# Patient Record
Sex: Female | Born: 1990 | Race: Black or African American | Hispanic: No | Marital: Single | Smoking: Never smoker
Health system: Southern US, Community
[De-identification: ages and names within clinical notes are randomized; demographics above are authoritative.]

## PROBLEM LIST (undated history)

## (undated) ENCOUNTER — Inpatient Hospital Stay (HOSPITAL_COMMUNITY): Payer: Self-pay

## (undated) DIAGNOSIS — R519 Headache, unspecified: Secondary | ICD-10-CM

## (undated) DIAGNOSIS — R51 Headache: Secondary | ICD-10-CM

## (undated) DIAGNOSIS — E669 Obesity, unspecified: Secondary | ICD-10-CM

## (undated) HISTORY — DX: Headache: R51

## (undated) HISTORY — DX: Headache, unspecified: R51.9

## (undated) HISTORY — PX: ANKLE SURGERY: SHX546

---

## 2011-09-27 ENCOUNTER — Ambulatory Visit: Payer: Self-pay | Admitting: Physician Assistant

## 2011-10-13 ENCOUNTER — Ambulatory Visit (INDEPENDENT_AMBULATORY_CARE_PROVIDER_SITE_OTHER): Payer: BC Managed Care – PPO | Admitting: Internal Medicine

## 2011-10-13 ENCOUNTER — Encounter: Payer: Self-pay | Admitting: Internal Medicine

## 2011-10-13 VITALS — BP 114/79 | HR 86 | Temp 97.5°F | Ht 61.75 in | Wt 206.0 lb

## 2011-10-13 DIAGNOSIS — S93401A Sprain of unspecified ligament of right ankle, initial encounter: Secondary | ICD-10-CM | POA: Insufficient documentation

## 2011-10-13 DIAGNOSIS — N926 Irregular menstruation, unspecified: Secondary | ICD-10-CM

## 2011-10-13 DIAGNOSIS — Z0000 Encounter for general adult medical examination without abnormal findings: Secondary | ICD-10-CM

## 2011-10-13 DIAGNOSIS — S93409A Sprain of unspecified ligament of unspecified ankle, initial encounter: Secondary | ICD-10-CM

## 2011-10-13 DIAGNOSIS — Z23 Encounter for immunization: Secondary | ICD-10-CM

## 2011-10-13 NOTE — Assessment & Plan Note (Signed)
Will get records on evaluation by podiatry.

## 2011-10-13 NOTE — Patient Instructions (Signed)
[email or web address]

## 2011-10-13 NOTE — Progress Notes (Signed)
  Subjective:    Patient ID: Debra Levy, female    DOB: 1990-10-07, 21 y.o.   MRN: [id number]  HPI 21 year old female presents to establish care. She reports that she has generally been healthy. She does note some irregular periods. However, these have not been bothersome enough to cause her to seek use of medications such as birth control to regulate her periods. She denies any heavy bleeding or cramping.  She notes that she recently hurt her right ankle playing basketball. She has been followed by podiatry and has been using a brace. She reports some improvement in her pain. She is also taking an unknown anti-inflammatory. She notes she has followup with podiatry later this month.  Outpatient Encounter Prescriptions as of 10/13/2011  Medication Sig Dispense Refill  . UNKNOWN TO PATIENT Pain medication for sprained ankle.        Review of Systems  Constitutional: Negative for fever, chills, appetite change, fatigue and unexpected weight change.  HENT: Negative for ear pain, congestion, sore throat, trouble swallowing, neck pain, voice change and sinus pressure.   Eyes: Negative for visual disturbance.  Respiratory: Negative for cough, shortness of breath, wheezing and stridor.   Cardiovascular: Negative for chest pain, palpitations and leg swelling.  Gastrointestinal: Negative for nausea, vomiting, abdominal pain, diarrhea, constipation, blood in stool, abdominal distention and anal bleeding.  Genitourinary: Positive for menstrual problem. Negative for dysuria and flank pain.  Musculoskeletal: Positive for arthralgias. Negative for myalgias and gait problem.  Skin: Negative for color change and rash.  Neurological: Negative for dizziness and headaches.  Hematological: Negative for adenopathy. Does not bruise/bleed easily.  Psychiatric/Behavioral: Negative for suicidal ideas, sleep disturbance and dysphoric mood. The patient is not nervous/anxious.        Objective:   Physical Exam    Constitutional: She is oriented to person, place, and time. She appears well-developed and well-nourished. No distress.  HENT:  Head: Normocephalic and atraumatic.  Right Ear: External ear normal.  Left Ear: External ear normal.  Nose: Nose normal.  Mouth/Throat: Oropharynx is clear and moist. No oropharyngeal exudate.  Eyes: Conjunctivae are normal. Pupils are equal, round, and reactive to light. Right eye exhibits no discharge. Left eye exhibits no discharge. No scleral icterus.  Neck: Normal range of motion. Neck supple. No tracheal deviation present. No thyromegaly present.  Cardiovascular: Normal rate, regular rhythm, normal heart sounds and intact distal pulses.  Exam reveals no gallop and no friction rub.   No murmur heard. Pulmonary/Chest: Effort normal and breath sounds normal. No respiratory distress. She has no wheezes. She has no rales. She exhibits no tenderness.  Abdominal: Soft. Bowel sounds are normal. She exhibits no distension. There is no tenderness.  Musculoskeletal: She exhibits no edema and no tenderness.       Right ankle: She exhibits normal range of motion and no swelling.  Lymphadenopathy:    She has no cervical adenopathy.  Neurological: She is alert and oriented to person, place, and time. No cranial nerve deficit. She exhibits normal muscle tone. Coordination normal.  Skin: Skin is warm and dry. No rash noted. She is not diaphoretic. No erythema. No pallor.  Psychiatric: She has a normal mood and affect. Her behavior is normal. Judgment and thought content normal.          Assessment & Plan:

## 2011-10-13 NOTE — Assessment & Plan Note (Signed)
Likely anovulatory cycles. Will check TSH with labs.

## 2011-10-14 LAB — CBC WITH DIFFERENTIAL/PLATELET
Basophils Absolute: 0 10*3/uL (ref 0.0–0.1)
Basophils Relative: 0 % (ref 0–1)
MCHC: 33.5 g/dL (ref 30.0–36.0)
Monocytes Absolute: 0.4 10*3/uL (ref 0.1–1.0)
Neutro Abs: 3.8 10*3/uL (ref 1.7–7.7)
Neutrophils Relative %: 59 % (ref 43–77)
Platelets: 241 10*3/uL (ref 150–400)
RDW: 13.2 % (ref 11.5–15.5)
WBC: 6.4 10*3/uL (ref 4.0–10.5)

## 2011-10-14 LAB — COMPREHENSIVE METABOLIC PANEL
ALT: 27 U/L (ref 0–35)
AST: 20 U/L (ref 0–37)
Albumin: 4.7 g/dL (ref 3.5–5.2)
CO2: 24 mEq/L (ref 19–32)
Calcium: 9.2 mg/dL (ref 8.4–10.5)
Chloride: 106 mEq/L (ref 96–112)
Potassium: 4 mEq/L (ref 3.5–5.3)

## 2011-10-14 LAB — LIPID PANEL
Cholesterol: 234 mg/dL — ABNORMAL HIGH (ref 0–200)
LDL Cholesterol: 174 mg/dL — ABNORMAL HIGH (ref 0–99)
VLDL: 8 mg/dL (ref 0–40)

## 2011-10-16 ENCOUNTER — Telehealth: Payer: Self-pay | Admitting: *Deleted

## 2011-10-16 NOTE — Telephone Encounter (Signed)
LMOM for patient to call back for lab results/SLS

## 2011-10-16 NOTE — Telephone Encounter (Signed)
Message copied by Regis Bill on Mon Oct 16, 2011  8:22 AM ------      Message from: Ronna Polio A      Created: Sun Oct 15, 2011  8:40 PM       Labs show that cholesterol is high. Recommend diet low in saturated fat and high in fiber. Repeat lipids and LFTS in 6 months.

## 2011-10-24 NOTE — Telephone Encounter (Signed)
Patient informed 10/16/11 Debra Levy lab result note/SLS

## 2012-04-04 ENCOUNTER — Ambulatory Visit: Payer: BC Managed Care – PPO

## 2012-04-17 ENCOUNTER — Ambulatory Visit (INDEPENDENT_AMBULATORY_CARE_PROVIDER_SITE_OTHER): Payer: BC Managed Care – PPO | Admitting: Internal Medicine

## 2012-04-17 ENCOUNTER — Encounter: Payer: Self-pay | Admitting: Internal Medicine

## 2012-04-17 VITALS — BP 112/70 | HR 91 | Temp 98.4°F | Ht 61.75 in | Wt 210.8 lb

## 2012-04-17 DIAGNOSIS — Z0000 Encounter for general adult medical examination without abnormal findings: Secondary | ICD-10-CM

## 2012-04-17 DIAGNOSIS — Z23 Encounter for immunization: Secondary | ICD-10-CM

## 2012-04-17 DIAGNOSIS — M79671 Pain in right foot: Secondary | ICD-10-CM | POA: Insufficient documentation

## 2012-04-17 DIAGNOSIS — M79609 Pain in unspecified limb: Secondary | ICD-10-CM

## 2012-04-17 DIAGNOSIS — Z13220 Encounter for screening for lipoid disorders: Secondary | ICD-10-CM

## 2012-04-17 LAB — LIPID PANEL
Cholesterol: 209 mg/dL — ABNORMAL HIGH (ref 0–200)
Triglycerides: 35 mg/dL (ref 0.0–149.0)

## 2012-04-17 LAB — COMPREHENSIVE METABOLIC PANEL
Albumin: 3.9 g/dL (ref 3.5–5.2)
BUN: 11 mg/dL (ref 6–23)
Calcium: 9.1 mg/dL (ref 8.4–10.5)
Chloride: 107 mEq/L (ref 96–112)
Glucose, Bld: 95 mg/dL (ref 70–99)
Potassium: 3.6 mEq/L (ref 3.5–5.1)

## 2012-04-17 NOTE — Progress Notes (Signed)
Subjective:    Patient ID: Debra Levy, female    DOB: 1991/02/14, 21 y.o.   MRN: [id number]  HPI 21 year old female presents for general exam. She reports that she is doing well. 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 She reports that she eats a relatively healthy diet. She reports that weight loss has been limited by decreased physical activity. She reports physical activity has been limited by right ankle and foot pain. She has been seen by a podiatrist and had cortisone injection into her foot with minimal improvement. She reports that she has tried participating in sports such as basketball but has been unable to because of pain. She takes neurontin with minimal improvement.  In regards to general health, she reports she is a nonsmoker, nondrinker. She follows basic safety measures such as wearing seatbelts in her car. She denies any issues with hearing or vision. She feels safe where she lives. She is not sexually active. Menstrual cycles have been regular.  Outpatient Encounter Prescriptions as of 04/17/2012  Medication Sig Dispense Refill  . gabapentin (NEURONTIN) 300 MG capsule Take 300 mg by mouth 2 (two) times daily.      Marland Kitchen DISCONTD: UNKNOWN TO PATIENT Pain medication for sprained ankle.       BP 112/70  Pulse 91  Temp 98.4 F (36.9 C) (Oral)  Ht 5' 1.75" (1.568 m)  Wt 210 lb 12 oz (95.596 kg)  BMI 38.86 kg/m2  SpO2 98%  LMP 04/10/2012  Review of Systems  Constitutional: Negative for fever, chills, appetite change, fatigue and unexpected weight change.  HENT: Negative for ear pain, congestion, sore throat, trouble swallowing, neck pain, voice change and sinus pressure.   Eyes: Negative for visual disturbance.  Respiratory: Negative for cough, shortness of breath, wheezing and stridor.   Cardiovascular: Negative for chest pain, palpitations and leg swelling.  Gastrointestinal: Negative for nausea, vomiting, abdominal pain, diarrhea, constipation,  blood in stool, abdominal distention and anal bleeding.  Genitourinary: Negative for dysuria and flank pain.  Musculoskeletal: Positive for myalgias and arthralgias. Negative for gait problem.  Skin: Negative for color change and rash.  Neurological: Negative for dizziness and headaches.  Hematological: Negative for adenopathy. Does not bruise/bleed easily.  Psychiatric/Behavioral: Negative for suicidal ideas, disturbed wake/sleep cycle and dysphoric mood. The patient is not nervous/anxious.        Objective:   Physical Exam  Constitutional: She is oriented to person, place, and time. She appears well-developed and well-nourished. No distress.  HENT:  Head: Normocephalic and atraumatic.  Right Ear: External ear normal.  Left Ear: External ear normal.  Nose: Nose normal.  Mouth/Throat: Oropharynx is clear and moist. No oropharyngeal exudate.  Eyes: Conjunctivae normal are normal. Pupils are equal, round, and reactive to light. Right eye exhibits no discharge. Left eye exhibits no discharge. No scleral icterus.  Neck: Normal range of motion. Neck supple. No tracheal deviation present. No thyromegaly present.  Cardiovascular: Normal rate, regular rhythm, normal heart sounds and intact distal pulses.  Exam reveals no gallop and no friction rub.   No murmur heard. Pulmonary/Chest: Effort normal and breath sounds normal. No respiratory distress. She has no wheezes. She has no rales. She exhibits no tenderness.  Abdominal: Soft. Bowel sounds are normal. She exhibits no distension. There is no tenderness.  Musculoskeletal: Normal range of motion. She exhibits no edema and no tenderness.       Right foot: She exhibits tenderness and deformity (pes planus). She exhibits normal  range of motion.  Lymphadenopathy:    She has no cervical adenopathy.  Neurological: She is alert and oriented to person, place, and time. No cranial nerve deficit. She exhibits normal muscle tone. Coordination normal.    Skin: Skin is warm and dry. No rash noted. She is not diaphoretic. No erythema. No pallor.  Psychiatric: She has a normal mood and affect. Her behavior is normal. Judgment and thought content normal.          Assessment & Plan:

## 2012-04-17 NOTE — Assessment & Plan Note (Signed)
General medical exam is normal today except as noted. We discussed making improvements in diet and increasing physical activity with a goal of weight loss. Patient reports no concerns in regards to hearing or vision. Reviewed avoidance of tobacco and alcohol use. We discussed elevated lipids on previous labs and plan to repeat labs today. Flu vaccine declined.

## 2012-04-17 NOTE — Assessment & Plan Note (Signed)
Patient with persistent right foot and ankle pain. Question if she might benefit from orthotics. Will set up podiatry evaluation.

## 2012-04-18 ENCOUNTER — Other Ambulatory Visit: Payer: BC Managed Care – PPO

## 2012-10-16 ENCOUNTER — Encounter: Payer: Self-pay | Admitting: Internal Medicine

## 2012-10-16 ENCOUNTER — Ambulatory Visit (INDEPENDENT_AMBULATORY_CARE_PROVIDER_SITE_OTHER): Payer: BC Managed Care – PPO | Admitting: Internal Medicine

## 2012-10-16 VITALS — BP 130/88 | HR 85 | Temp 98.5°F | Wt 219.0 lb

## 2012-10-16 DIAGNOSIS — R030 Elevated blood-pressure reading, without diagnosis of hypertension: Secondary | ICD-10-CM

## 2012-10-16 DIAGNOSIS — E785 Hyperlipidemia, unspecified: Secondary | ICD-10-CM | POA: Insufficient documentation

## 2012-10-16 DIAGNOSIS — E669 Obesity, unspecified: Secondary | ICD-10-CM | POA: Insufficient documentation

## 2012-10-16 DIAGNOSIS — G43909 Migraine, unspecified, not intractable, without status migrainosus: Secondary | ICD-10-CM | POA: Insufficient documentation

## 2012-10-16 DIAGNOSIS — IMO0001 Reserved for inherently not codable concepts without codable children: Secondary | ICD-10-CM

## 2012-10-16 MED ORDER — PROPRANOLOL HCL ER 60 MG PO CP24: 60.0000 mg | ORAL_CAPSULE | Freq: Every day | ORAL | Status: DC

## 2012-10-16 NOTE — Assessment & Plan Note (Signed)
Symptoms consistent with migraine headaches. Will try adding propranolol to help control headache symptoms. Continue tylenol or ibuprofen for severe pain. Pt will email or call with update. She will keep a headache diary to better identify triggers. Follow up 4 weeks and prn.

## 2012-10-16 NOTE — Assessment & Plan Note (Signed)
Will recheck LFTs and lipids with labs today.

## 2012-10-16 NOTE — Progress Notes (Signed)
Subjective:    Patient ID: Debra Levy, female    DOB: Aug 08, 1990, 21 y.o.   MRN: [id number]  HPI 22 year old female with history of obesity presents for followup. She reports she is generally feeling well. Over the last several months she has had frequent headaches. She describes these as pain across the top of her head with associated photophobia and phonophobia. She denies nausea or visual changes. Her mother suffers from similar migraine headaches. She is unsure what triggers these headaches questions whether certain foods such as spicy foods may contribute as she has noticed headaches after eating these foods. She denies any focal neurologic symptoms. She typically uses Tylenol or ibuprofen with resolution of her symptoms. Headaches occur several times per week.   Outpatient Encounter Prescriptions as of 10/16/2012  Medication Sig Dispense Refill  . [DISCONTINUED] gabapentin (NEURONTIN) 300 MG capsule Take 300 mg by mouth 2 (two) times daily.      . propranolol ER (INDERAL LA) 60 MG 24 hr capsule Take 1 capsule (60 mg total) by mouth daily.  30 capsule  3   No facility-administered encounter medications on file as of 10/16/2012.   BP 130/88  Pulse 85  Temp(Src) 98.5 F (36.9 C) (Oral)  Wt 219 lb (99.338 kg)  BMI 40.4 kg/m2  SpO2 97%  LMP 09/15/2012  Review of Systems  Constitutional: Negative for fever, chills, appetite change, fatigue and unexpected weight change.  HENT: Negative for ear pain, congestion, sore throat, trouble swallowing, neck pain, voice change and sinus pressure.   Eyes: Positive for photophobia. Negative for visual disturbance.  Respiratory: Negative for cough, shortness of breath, wheezing and stridor.   Cardiovascular: Negative for chest pain, palpitations and leg swelling.  Gastrointestinal: Negative for nausea, vomiting, abdominal pain, diarrhea, constipation, blood in stool, abdominal distention and anal bleeding.  Genitourinary: Negative for dysuria and  flank pain.  Musculoskeletal: Negative for myalgias, arthralgias and gait problem.  Skin: Negative for color change and rash.  Neurological: Positive for headaches. Negative for dizziness.  Hematological: Negative for adenopathy. Does not bruise/bleed easily.  Psychiatric/Behavioral: Negative for suicidal ideas, sleep disturbance and dysphoric mood. The patient is not nervous/anxious.        Objective:   Physical Exam  Constitutional: She is oriented to person, place, and time. She appears well-developed and well-nourished. No distress.  HENT:  Head: Normocephalic and atraumatic.  Right Ear: External ear normal.  Left Ear: External ear normal.  Nose: Nose normal.  Mouth/Throat: Oropharynx is clear and moist. No oropharyngeal exudate.  Eyes: Conjunctivae are normal. Pupils are equal, round, and reactive to light. Right eye exhibits no discharge. Left eye exhibits no discharge. No scleral icterus.  Neck: Normal range of motion. Neck supple. No tracheal deviation present. No thyromegaly present.  Cardiovascular: Normal rate, regular rhythm, normal heart sounds and intact distal pulses.  Exam reveals no gallop and no friction rub.   No murmur heard. Pulmonary/Chest: Effort normal and breath sounds normal. No accessory muscle usage. Not tachypneic. No respiratory distress. She has no decreased breath sounds. She has no wheezes. She has no rhonchi. She has no rales. She exhibits no tenderness.  Musculoskeletal: Normal range of motion. She exhibits no edema and no tenderness.  Lymphadenopathy:    She has no cervical adenopathy.  Neurological: She is alert and oriented to person, place, and time. No cranial nerve deficit. She exhibits normal muscle tone. Coordination normal.  Skin: Skin is warm and dry. No rash noted. She is not diaphoretic. No  erythema. No pallor.  Psychiatric: She has a normal mood and affect. Her behavior is normal. Judgment and thought content normal.           Assessment & Plan:

## 2012-10-16 NOTE — Assessment & Plan Note (Signed)
BP slightly elevated today on initial check. Will recheck at visit in 4 weeks.

## 2012-10-16 NOTE — Assessment & Plan Note (Signed)
Body mass index is 40.4 kg/(m^2).  Wt Readings from Last 3 Encounters:  10/16/12 219 lb (99.338 kg)  04/17/12 210 lb 12 oz (95.596 kg)  10/13/11 206 lb (93.441 kg) (98%*, Z = 2.03)   * Growth percentiles are based on CDC 2-20 Years data.   Encouraged effort at healthy diet and regular physical activity. Exercise has been limited by right ankle pain. Follow up 4 weeks.

## 2012-10-17 LAB — COMPREHENSIVE METABOLIC PANEL
BUN: 13 mg/dL (ref 6–23)
CO2: 24 mEq/L (ref 19–32)
Calcium: 9.2 mg/dL (ref 8.4–10.5)
Chloride: 106 mEq/L (ref 96–112)
Creatinine, Ser: 0.7 mg/dL (ref 0.4–1.2)
GFR: 135.99 mL/min (ref >60.00)
Glucose, Bld: 85 mg/dL (ref 70–99)

## 2012-10-17 LAB — CBC WITH DIFFERENTIAL/PLATELET
Basophils Relative: 0.2 % (ref 0.0–3.0)
Eosinophils Absolute: 0.1 10*3/uL (ref 0.0–0.7)
Eosinophils Relative: 2 % (ref 0.0–5.0)
Lymphocytes Relative: 40.8 % (ref 12.0–46.0)
MCHC: 34.5 g/dL (ref 30.0–36.0)
Neutrophils Relative %: 52.2 % (ref 43.0–77.0)
RBC: 4.49 Mil/uL (ref 3.87–5.11)
WBC: 6 10*3/uL (ref 4.5–10.5)

## 2012-10-17 LAB — LDL CHOLESTEROL, DIRECT: Direct LDL: 153.7 mg/dL

## 2012-10-17 LAB — LIPID PANEL
HDL: 49.9 mg/dL (ref >39.00)
Triglycerides: 33 mg/dL (ref 0.0–149.0)

## 2012-11-21 ENCOUNTER — Ambulatory Visit: Payer: BC Managed Care – PPO | Admitting: Internal Medicine

## 2012-12-05 ENCOUNTER — Ambulatory Visit: Payer: BC Managed Care – PPO | Admitting: Internal Medicine

## 2012-12-12 ENCOUNTER — Ambulatory Visit: Payer: BC Managed Care – PPO | Admitting: Adult Health

## 2013-02-25 ENCOUNTER — Ambulatory Visit (INDEPENDENT_AMBULATORY_CARE_PROVIDER_SITE_OTHER): Payer: BC Managed Care – PPO | Admitting: Adult Health

## 2013-02-25 ENCOUNTER — Encounter: Payer: Self-pay | Admitting: Adult Health

## 2013-02-25 VITALS — BP 110/68 | HR 86 | Temp 98.1°F | Resp 12 | Ht 61.75 in | Wt 224.0 lb

## 2013-02-25 DIAGNOSIS — H6692 Otitis media, unspecified, left ear: Secondary | ICD-10-CM | POA: Insufficient documentation

## 2013-02-25 DIAGNOSIS — H6690 Otitis media, unspecified, unspecified ear: Secondary | ICD-10-CM

## 2013-02-25 MED ORDER — ANTIPYRINE-BENZOCAINE 5.4-1.4 % OT SOLN: 3.0000 [drp] | OTIC | Status: DC | PRN

## 2013-02-25 MED ORDER — AMOXICILLIN-POT CLAVULANATE 875-125 MG PO TABS: 1.0000 | ORAL_TABLET | Freq: Two times a day (BID) | ORAL | Status: DC

## 2013-02-25 NOTE — Patient Instructions (Addendum)
Otalgia Otalgia is pain in or around the ear. When the pain is from the ear itself it is called primary otalgia. Pain may also be coming from somewhere else, like the head and neck. This is called secondary otalgia.  CAUSES  Causes of primary otalgia include:  Middle ear infection.  It can also be caused by injury to the ear or infection of the ear canal (swimmer's ear). Swimmer's ear causes pain, swelling and often drainage from the ear canal. Causes of secondary otalgia include:  Sinus infections.  Allergies and colds that cause stuffiness of the nose and tubes that drain the ears (eustachian tubes).  Dental problems like cavities, gum infections or teething.  Sore Throat (tonsillitis and pharyngitis).  Swollen glands in the neck.  Infection of the bone behind the ear (mastoiditis).  TMJ discomfort (problems with the joint between your jaw and your skull).  Other problems such as nerve disorders, circulation problems, heart disease and tumors of the head and neck can also cause symptoms of ear pain. This is rare. DIAGNOSIS  Evaluation, Diagnosis and Testing:  Examination by your medical caregiver is recommended to evaluate and diagnose the cause of otalgia.  Further testing or referral to a specialist may be indicated if the cause of the ear pain is not found and the symptom persists. TREATMENT   Your doctor may prescribe antibiotics if an ear infection is diagnosed.  Pain relievers and topical analgesics may be recommended.  It is important to take all medications as prescribed. HOME CARE INSTRUCTIONS   It may be helpful to sleep with the painful ear in the up position.  A warm compress over the painful ear may provide relief.  A soft diet and avoiding gum may help while ear pain is present. SEEK IMMEDIATE MEDICAL CARE IF:  You develop severe pain, a high fever, repeated vomiting or dehydration.  You develop extreme dizziness, headache, confusion, ringing in the  ears (tinnitus) or hearing loss. Document Released: 07/13/2004 Document Revised: 08/28/2011 Document Reviewed: 04/14/2009 [facility] Patient Information 2014 [place], [place].  Start Augmentin twice a day for 10 days. Also use the ear drops (Auralgan) 3 drops into the left ear every 2 hours as needed for pain. Please let us know if you are not any better within 3-4 days.

## 2013-02-25 NOTE — Progress Notes (Signed)
  Subjective:    Patient ID: Debra Levy, female    DOB: [birth year], 22 y.o.   MRN: [id number]  HPI  Patient is a pleasant 22 y/o female who presents with otalgia which started over the weekend. She denies fever or chills. She has not taken any over-the-counter medications. She reports that her mother and grandmother instilled some peroxide in her ear thinking that it would help. Patient reports it did not help.   Review of Systems  HENT: Positive for ear pain.        Objective:   Physical Exam  Constitutional: She is oriented to person, place, and time. No distress.  HENT:  Head: Normocephalic and atraumatic.  Right Ear: External ear normal.  Left tympanic membrane erythematous and inflamed..  Cardiovascular: Normal rate and regular rhythm.   Pulmonary/Chest: Effort normal. No respiratory distress.  Neurological: She is alert and oriented to person, place, and time.  Skin: Skin is warm and dry.  Psychiatric: She has a normal mood and affect. Her behavior is normal. Judgment and thought content normal.          Assessment & Plan:

## 2013-02-25 NOTE — Assessment & Plan Note (Signed)
Start Augmentin twice a day x10 days. Auralgan ear drops into left ear every 2 hours as needed for pain.

## 2013-02-28 ENCOUNTER — Telehealth: Payer: Self-pay | Admitting: Internal Medicine

## 2013-02-28 NOTE — Telephone Encounter (Signed)
Is she having fever? Antibiotic should cover ear infection. How often is she using the ear drops for pain? Dr. Dan Humphreys has a spot saved at 4:15pm that she can be seen at if she needs to. Needs to get here a little earlier, though.

## 2013-02-28 NOTE — Telephone Encounter (Signed)
Pt was seen on 9/9 by Raquel.  States pt had bad ear infection and was prescribed ear drops and antibiotic.  States they were told if it did not improve to call back.  Now calling stating it has not improved.  Asking what next step is.

## 2013-02-28 NOTE — Telephone Encounter (Signed)
Patient never returned call  

## 2013-02-28 NOTE — Telephone Encounter (Signed)
Spoke with patient she state she is not having any fever and has been using the ear drops every 2 hours. Offered patient an appointment to come in to see Dr. Dan Humphreys, she will call back to let me know if she will be able to make it.

## 2013-03-04 ENCOUNTER — Encounter: Payer: Self-pay | Admitting: Internal Medicine

## 2013-03-04 ENCOUNTER — Ambulatory Visit (INDEPENDENT_AMBULATORY_CARE_PROVIDER_SITE_OTHER): Payer: BC Managed Care – PPO | Admitting: Internal Medicine

## 2013-03-04 VITALS — BP 112/68 | HR 91 | Temp 98.6°F | Resp 14 | Wt 221.5 lb

## 2013-03-04 DIAGNOSIS — H6690 Otitis media, unspecified, unspecified ear: Secondary | ICD-10-CM

## 2013-03-04 DIAGNOSIS — H6692 Otitis media, unspecified, left ear: Secondary | ICD-10-CM

## 2013-03-04 MED ORDER — LEVOFLOXACIN 500 MG PO TABS: 500.0000 mg | ORAL_TABLET | Freq: Every day | ORAL | Status: DC

## 2013-03-04 NOTE — Assessment & Plan Note (Signed)
Symptoms of ear pain, drainage despite use of Augmentin. Exam remarkable for bulging TM with purulent effusion and diffuse inflammation. Will change antibiotics to Levaquin. Stop augmentin and stop auralgan. Will use Ibuprofen or Tylenol prn pain or fever. If symptoms of pain persistent, would favor referral to ENT for evaluation for tympanostomy tube.

## 2013-03-04 NOTE — Progress Notes (Signed)
Subjective:    Patient ID: Debra Levy, female    DOB: 05-16-1991, 22 y.o.   MRN: [id number]  HPI 22YO female presents for acute visit c/o recurrent left ear pain. She was initially seen on 02/25/2013 complaining of left ear pain. She was diagnosed with otitis media and treated with Augmentin. She reports that symptoms of pain initially improved however for the last couple of days symptoms have become much worse with increased pain and occasional purulent drainage from her ear. She denies fever or chills. She denies nasal congestion or cough. She has not recently been swimming. She denies trauma to her ear. She does not have a history of resistant infections.  Outpatient Prescriptions Prior to Visit  Medication Sig Dispense Refill  . amoxicillin-clavulanate (AUGMENTIN) 875-125 MG per tablet Take 1 tablet by mouth 2 (two) times daily.  20 tablet  0  . antipyrine-benzocaine (AURALGAN) otic solution Place 3 drops into the left ear every 2 (two) hours as needed for pain.  10 mL  0   No facility-administered medications prior to visit.   BP 112/68  Pulse 91  Temp(Src) 98.6 F (37 C) (Oral)  Resp 14  Wt 221 lb 8 oz (100.472 kg)  BMI 40.87 kg/m2  SpO2 99%  LMP 02/11/2013  Review of Systems  Constitutional: Negative for fever, chills and unexpected weight change.  HENT: Positive for ear pain and ear discharge. Negative for hearing loss, nosebleeds, congestion, sore throat, facial swelling, rhinorrhea, sneezing, mouth sores, trouble swallowing, neck pain, neck stiffness, voice change, postnasal drip, sinus pressure and tinnitus.   Eyes: Negative for pain, discharge, redness and visual disturbance.  Respiratory: Negative for cough, chest tightness, shortness of breath, wheezing and stridor.   Cardiovascular: Negative for chest pain, palpitations and leg swelling.  Musculoskeletal: Negative for myalgias and arthralgias.  Skin: Negative for color change and rash.  Neurological: Negative for  dizziness, weakness, light-headedness and headaches.  Hematological: Negative for adenopathy.       Objective:   Physical Exam  Constitutional: She is oriented to person, place, and time. She appears well-developed and well-nourished. No distress.  HENT:  Head: Normocephalic and atraumatic.  Right Ear: Tympanic membrane, external ear and ear canal normal.  Left Ear: External ear normal. Tympanic membrane is injected, erythematous and bulging. A middle ear effusion is present.  Nose: Nose normal.  Mouth/Throat: Oropharynx is clear and moist. No oropharyngeal exudate.  Eyes: Conjunctivae are normal. Pupils are equal, round, and reactive to light. Right eye exhibits no discharge. Left eye exhibits no discharge. No scleral icterus.  Neck: Normal range of motion. Neck supple. No tracheal deviation present. No thyromegaly present.  Cardiovascular: Normal rate, regular rhythm, normal heart sounds and intact distal pulses.  Exam reveals no gallop and no friction rub.   No murmur heard. Pulmonary/Chest: Effort normal and breath sounds normal. No accessory muscle usage. Not tachypneic. No respiratory distress. She has no decreased breath sounds. She has no wheezes. She has no rhonchi. She has no rales. She exhibits no tenderness.  Musculoskeletal: Normal range of motion. She exhibits no edema and no tenderness.  Lymphadenopathy:    She has no cervical adenopathy.  Neurological: She is alert and oriented to person, place, and time. No cranial nerve deficit. She exhibits normal muscle tone. Coordination normal.  Skin: Skin is warm and dry. No rash noted. She is not diaphoretic. No erythema. No pallor.  Psychiatric: She has a normal mood and affect. Her behavior is normal. Judgment and thought  content normal.          Assessment & Plan:

## 2013-03-04 NOTE — Patient Instructions (Signed)
Email with update on Thursday.  Call immediately if any persistent fever, chills, ear pain.  Stop Auralgan.

## 2013-03-11 ENCOUNTER — Ambulatory Visit: Payer: BC Managed Care – PPO | Admitting: Internal Medicine

## 2013-03-12 ENCOUNTER — Ambulatory Visit (INDEPENDENT_AMBULATORY_CARE_PROVIDER_SITE_OTHER): Payer: BC Managed Care – PPO | Admitting: Adult Health

## 2013-03-12 ENCOUNTER — Encounter: Payer: Self-pay | Admitting: Adult Health

## 2013-03-12 VITALS — BP 108/76 | HR 74 | Temp 98.2°F | Resp 12 | Wt 223.5 lb

## 2013-03-12 DIAGNOSIS — H6692 Otitis media, unspecified, left ear: Secondary | ICD-10-CM

## 2013-03-12 DIAGNOSIS — H6690 Otitis media, unspecified, unspecified ear: Secondary | ICD-10-CM

## 2013-03-12 NOTE — Assessment & Plan Note (Signed)
Unresolved despite 2 courses of antibiotics. Now with some hearing loss. Refer to ENT.

## 2013-03-12 NOTE — Progress Notes (Signed)
  Subjective:    Patient ID: Debra Levy, female    DOB: [birth year], 22 y.o.   MRN: [id number]  HPI  Patient is a pleasant 22 y/o female who presents to clinic with ongoing problems with her left ear. She was seen earlier in the month for left ear pain, otitis and treated with Augmentin. Symptoms persisted so she was given levaquin. She completed course of Levaquin 2 days ago. Symptoms are ongoing with complaints of pain in the left ear. She is also having some hearing loss on that side as well. She denies fever or any other symptom.   Review of Systems  Constitutional: Negative for fever and chills.  HENT: Positive for hearing loss and ear pain. Negative for congestion, postnasal drip, sinus pressure and tinnitus.        Objective:   Physical Exam  Constitutional: She is oriented to person, place, and time.  22 year old female in no apparent distress  HENT:  Right Ear: External ear normal.  Left otitis media  Neurological: She is alert and oriented to person, place, and time.  Skin: Skin is warm and dry.  Psychiatric: She has a normal mood and affect. Her behavior is normal. Judgment and thought content normal.          Assessment & Plan:

## 2013-03-15 ENCOUNTER — Emergency Department (HOSPITAL_COMMUNITY)
Admission: EM | Admit: 2013-03-15 | Discharge: 2013-03-15 | Disposition: A | Discharge status: Home / Self Care | Payer: BC Managed Care – PPO | Attending: Emergency Medicine | Admitting: Emergency Medicine

## 2013-03-15 ENCOUNTER — Encounter (HOSPITAL_COMMUNITY): Payer: Self-pay | Admitting: *Deleted

## 2013-03-15 DIAGNOSIS — J45909 Unspecified asthma, uncomplicated: Secondary | ICD-10-CM | POA: Insufficient documentation

## 2013-03-15 DIAGNOSIS — H6092 Unspecified otitis externa, left ear: Secondary | ICD-10-CM

## 2013-03-15 DIAGNOSIS — H60399 Other infective otitis externa, unspecified ear: Secondary | ICD-10-CM | POA: Insufficient documentation

## 2013-03-15 MED ORDER — TRAMADOL HCL 50 MG PO TABS: 50.0000 mg | ORAL_TABLET | Freq: Four times a day (QID) | ORAL | Status: DC | PRN

## 2013-03-15 NOTE — ED Notes (Signed)
Pt c/o increase pain and swelling in left ear, around the outside of the ear, and down the left jaw line since yesterday after her ENT took a culture.  Left neck and cheek swelling visible and tender to touch.  Ear canal swelling also noted. Pt reports drainage.  Pt reports new ear drops since her visit yesterday as well.

## 2013-03-15 NOTE — ED Provider Notes (Signed)
Medical screening examination/treatment/procedure(s) were performed by non-physician practitioner and as supervising physician I was immediately available for consultation/collaboration.   Gwyneth Sprout, MD 03/15/13 [phone]

## 2013-03-15 NOTE — ED Notes (Signed)
Pt and family member reports having left ear infection x 2 weeks, has taken amoxicillin and levaquin with no relief. Pain is increasing and now pain when talking. Airway intact.

## 2013-03-15 NOTE — ED Provider Notes (Signed)
CSN: [id number]     Arrival date & time 03/15/13  1603 History  This chart was scribed for non-physician practitioner Wynetta Emery, PA-C working with Gwyneth Sprout, MD by Danella Maiers, ED Scribe. This patient was seen in room TR06C/TR06C and the patient's care was started at 4:29 PM.   Chief Complaint  Patient presents with  . Otalgia   Patient is a 22 y.o. female presenting with ear pain. The history is provided by the patient. No language interpreter was used.  Otalgia  HPI Comments: Debra Levy is a 22 y.o. female who presents to the Emergency Department complaining of worsening left ear pain with associated tinnitus and facial swelling onset two weeks ago, when Pt had dental procedure that resulted in an  abcess. Pt reports taking amoxicillin x2 weeks ago and was switched to levaquin. She reports no relief with levaquin. She received ciprodex antibiotic ear drops from [facility] yesterday. She reports pain is worse when eating. She has been taking ibuprofen and tylenol. She denies fever, cough, nausea, emesis, sinus pressure, difficulty swallowing. She denies h/o diabetes.  Past Medical History  Diagnosis Date  . Asthma    History reviewed. No pertinent past surgical history. Family History  Problem Relation Age of Onset  . Diabetes Mother   . Diabetes Father    History  Substance Use Topics  . Smoking status: Never Smoker   . Smokeless tobacco: Never Used  . Alcohol Use: No   OB History   Grav Para Term Preterm Abortions TAB SAB Ect Mult Living                 Review of Systems  HENT: Positive for ear pain.   A complete 10 system review of systems was obtained and all systems are negative except as noted in the HPI and PMH.   Allergies  Review of patient's allergies indicates no known allergies.  Home Medications  No current outpatient prescriptions on file. BP 120/66  Pulse 100  Temp(Src) 98.5 F (36.9 C) (Oral)  Resp 18  SpO2 97%  LMP  02/12/2013 Physical Exam  Nursing note and vitals reviewed. Constitutional: She is oriented to person, place, and time. She appears well-developed and well-nourished. No distress.  HENT:  Head: Normocephalic.  Mouth/Throat: Oropharynx is clear and moist.  Left outer ear canal erythematous and edematous, TM is not bulging, mastoid is nontender to palpation.  No facial swelling or induration. Generally good dentition, no gingival swelling, erythema or tenderness to palpation. Patient is handling their secretions. There is no tenderness to palpation or firmness underneath tongue bilaterally. No trismus.    Eyes: Conjunctivae and EOM are normal. Pupils are equal, round, and reactive to light.  Neck: Normal range of motion. Neck supple.  Cardiovascular: Normal rate.   Pulmonary/Chest: Effort normal and breath sounds normal. No stridor. No respiratory distress. She has no wheezes. She has no rales. She exhibits no tenderness.  Abdominal: Soft. Bowel sounds are normal. She exhibits no distension and no mass. There is no tenderness. There is no rebound and no guarding.  Musculoskeletal: Normal range of motion.  Lymphadenopathy:    She has cervical adenopathy.  Neurological: She is alert and oriented to person, place, and time.  Psychiatric: She has a normal mood and affect.    ED Course  Procedures (including critical care time) Medications - No data to display  DIAGNOSTIC STUDIES: Oxygen Saturation is 97% on RA, normal by my interpretation.    COORDINATION OF CARE:  4:54 PM- Discussed treatment plan with pt which includes continuing ear drops and treating with pain meds. Pt agrees to plan.    Labs Review Labs Reviewed - No data to display Imaging Review No results found.  MDM   1. Otitis externa, left     Filed Vitals:   03/15/13 1613  BP: 120/66  Pulse: 100  Temp: 98.5 F (36.9 C)  TempSrc: Oral  Resp: 18  SpO2: 97%     Debra Levy is a 22 y.o. female with otitis  externa. She started Ciprodex yesterday. I have explained to patient that she will need to give the medication time to work. In addition I will write her for additional pain medication. I've discussed return precautions and asked her to follow with her ear nose and throat physician.  Pt is hemodynamically stable, appropriate for, and amenable to discharge at this time. Pt verbalized understanding and agrees with care plan. All questions answered. Outpatient follow-up and specific return precautions discussed.    New Prescriptions   TRAMADOL (ULTRAM) 50 MG TABLET    Take 1 tablet (50 mg total) by mouth every 6 (six) hours as needed for pain.    I personally performed the services described in this documentation, which was scribed in my presence. The recorded information has been reviewed and is accurate.  Note: Portions of this report may have been transcribed using voice recognition software. Every effort was made to ensure accuracy; however, inadvertent computerized transcription errors may be present    Wynetta Emery, PA-C 03/15/13 2058

## 2013-04-17 ENCOUNTER — Ambulatory Visit (INDEPENDENT_AMBULATORY_CARE_PROVIDER_SITE_OTHER): Payer: BC Managed Care – PPO

## 2013-04-17 DIAGNOSIS — Z23 Encounter for immunization: Secondary | ICD-10-CM

## 2013-08-01 ENCOUNTER — Ambulatory Visit: Payer: BC Managed Care – PPO | Admitting: Internal Medicine

## 2013-11-05 ENCOUNTER — Ambulatory Visit: Payer: BC Managed Care – PPO | Admitting: Internal Medicine

## 2013-11-17 ENCOUNTER — Ambulatory Visit: Payer: BC Managed Care – PPO | Admitting: Internal Medicine

## 2014-02-10 ENCOUNTER — Ambulatory Visit (INDEPENDENT_AMBULATORY_CARE_PROVIDER_SITE_OTHER): Payer: BC Managed Care – PPO | Admitting: Adult Health

## 2014-02-10 ENCOUNTER — Encounter: Payer: Self-pay | Admitting: Adult Health

## 2014-02-10 VITALS — BP 116/80 | HR 83 | Temp 98.2°F | Resp 16 | Ht 63.0 in | Wt 220.5 lb

## 2014-02-10 DIAGNOSIS — H9202 Otalgia, left ear: Secondary | ICD-10-CM

## 2014-02-10 DIAGNOSIS — H9209 Otalgia, unspecified ear: Secondary | ICD-10-CM

## 2014-02-10 NOTE — Progress Notes (Signed)
Pre visit review using our clinic review tool, if applicable. No additional management support is needed unless otherwise documented below in the visit note. 

## 2014-02-10 NOTE — Progress Notes (Signed)
Patient ID: Debra Levy, female   DOB: Dec 15, 1990, 23 y.o.   MRN: [id number]   Subjective:    Patient ID: Debra Levy, female    DOB: 1991/01/10, 23 y.o.   MRN: [id number]  HPI Pt is a pleasant 23 y/o female who presents to clinic for c/o left outer ear pain when she wears her headsets. Hx of otitis media and externa. Denies any internal pain, hearing difficulties or other associated symptoms.   Past Medical History  Diagnosis Date  . Asthma      Review of Systems  Constitutional: Negative for fever and chills.  HENT: Positive for ear pain (left external ear pain).   All other systems reviewed and are negative.      Objective:  BP 116/80  Pulse 83  Temp(Src) 98.2 F (36.8 C) (Oral)  Resp 16  Ht  (1.6 m)  Wt 220 lb 8 oz (100.018 kg)  BMI 39.07 kg/m2  SpO2 98%   Physical Exam  Constitutional: She is oriented to person, place, and time. No distress.  HENT:  Head: Normocephalic and atraumatic.  Right Ear: External ear normal.  Left external ear normal in appearance. No erythema or inflammation noted. Internally, TM is intact. The canal has a small amount of cerumen without obstructing the view of TM.   Cardiovascular: Normal rate and regular rhythm.   Pulmonary/Chest: Effort normal. No respiratory distress.  Lymphadenopathy:    She has no cervical adenopathy.  Neurological: She is alert and oriented to person, place, and time.  Skin: Skin is warm and dry.  Psychiatric: She has a normal mood and affect. Her behavior is normal. Judgment and thought content normal.      Assessment & Plan:   1. Ear pain, left No s/s infection. Pain is only with use of headset. Suspect the pain/discomfort is coming from headsets that may be too tight. Instructed pt to make sure this is not occuring. No tenderness with palpation of external ear or surrounding areas.

## 2014-02-19 ENCOUNTER — Ambulatory Visit: Payer: BC Managed Care – PPO | Admitting: Internal Medicine

## 2014-03-02 ENCOUNTER — Ambulatory Visit: Payer: BC Managed Care – PPO | Admitting: Internal Medicine

## 2014-03-02 DIAGNOSIS — Z0289 Encounter for other administrative examinations: Secondary | ICD-10-CM

## 2014-03-26 ENCOUNTER — Ambulatory Visit: Payer: BC Managed Care – PPO | Admitting: Internal Medicine

## 2014-05-12 ENCOUNTER — Ambulatory Visit: Payer: BC Managed Care – PPO | Admitting: Nurse Practitioner

## 2014-05-13 ENCOUNTER — Ambulatory Visit: Payer: BC Managed Care – PPO | Admitting: Nurse Practitioner

## 2014-05-13 ENCOUNTER — Telehealth: Payer: Self-pay | Admitting: Internal Medicine

## 2014-05-13 NOTE — Telephone Encounter (Signed)
Do you want her no-showed and left on schedule, or removed so is not a no show.

## 2014-05-13 NOTE — Telephone Encounter (Signed)
Pt arrived late for appt. The appt has rescheduled. Please advise to off schedule for today/msn

## 2014-05-20 ENCOUNTER — Ambulatory Visit: Payer: BC Managed Care – PPO | Admitting: Nurse Practitioner

## 2014-07-27 ENCOUNTER — Ambulatory Visit: Payer: Self-pay | Admitting: Internal Medicine

## 2014-08-05 ENCOUNTER — Emergency Department (HOSPITAL_COMMUNITY)
Admission: EM | Admit: 2014-08-05 | Discharge: 2014-08-05 | Disposition: A | Discharge status: Home / Self Care | Payer: BLUE CROSS/BLUE SHIELD | Attending: Emergency Medicine | Admitting: Emergency Medicine

## 2014-08-05 ENCOUNTER — Encounter (HOSPITAL_COMMUNITY): Payer: Self-pay | Admitting: *Deleted

## 2014-08-05 DIAGNOSIS — R519 Headache, unspecified: Secondary | ICD-10-CM

## 2014-08-05 DIAGNOSIS — E669 Obesity, unspecified: Secondary | ICD-10-CM | POA: Insufficient documentation

## 2014-08-05 DIAGNOSIS — R51 Headache: Secondary | ICD-10-CM | POA: Insufficient documentation

## 2014-08-05 DIAGNOSIS — J45909 Unspecified asthma, uncomplicated: Secondary | ICD-10-CM | POA: Insufficient documentation

## 2014-08-05 HISTORY — DX: Obesity, unspecified: E66.9

## 2014-08-05 MED ORDER — DIPHENHYDRAMINE HCL 50 MG/ML IJ SOLN
25.0000 mg | Freq: Once | INTRAMUSCULAR | Status: AC
  Administered 2014-08-05: 25 mg via INTRAVENOUS
  Filled 2014-08-05: qty 1

## 2014-08-05 MED ORDER — ASPIRIN-ACETAMINOPHEN-CAFFEINE 250-250-65 MG PO TABS: 1.0000 | ORAL_TABLET | Freq: Four times a day (QID) | ORAL | Status: DC | PRN

## 2014-08-05 MED ORDER — SODIUM CHLORIDE 0.9 % IV BOLUS (SEPSIS)
1000.0000 mL | Freq: Once | INTRAVENOUS | Status: AC
  Administered 2014-08-05: 1000 mL via INTRAVENOUS

## 2014-08-05 MED ORDER — DEXAMETHASONE SODIUM PHOSPHATE 10 MG/ML IJ SOLN
10.0000 mg | Freq: Once | INTRAMUSCULAR | Status: AC
  Administered 2014-08-05: 10 mg via INTRAVENOUS
  Filled 2014-08-05: qty 1

## 2014-08-05 MED ORDER — METOCLOPRAMIDE HCL 5 MG/ML IJ SOLN
10.0000 mg | Freq: Once | INTRAMUSCULAR | Status: AC
Start: 2014-08-05 — End: 2014-08-05
  Administered 2014-08-05: 10 mg via INTRAVENOUS
  Filled 2014-08-05: qty 2

## 2014-08-05 NOTE — ED Provider Notes (Signed)
CSN: [id number]     Arrival date & time 08/05/14  1635 History   First Levy Initiated Contact with Patient 08/05/14 2101     Chief Complaint  Patient presents with  . Headache     (Consider location/radiation/quality/duration/timing/severity/associated sxs/prior Treatment) HPI   PCP: Debra Levy Blood pressure 107/81, pulse 81, temperature 98.1 F (36.7 C), temperature source Oral, resp. rate 18, SpO2 100 %.  Debra Levy is a 24 y.o.female with a significant PMH of headaches, asthma, obesity presents to the ER with complaints of headache. She gets headaches on and off and this headache is of the same quality. She has been visiting her mom upstairs who is admitted and admits to decreased fluid and food intake. She has had some sensitivity to light, the pain is left parietal, and pulsating.  Negative Review of Symptoms: no weakness (generalized or focal), change of vision, neck pain, fever, chest pain, SOB, confusion, blurry vision, vomiting, diarrhea, nausea, confusion, dysuria, vaginal discharge, vaginal bleeding.  Past Medical History  Diagnosis Date  . Asthma   . Obesity    History reviewed. No pertinent past surgical history. Family History  Problem Relation Age of Onset  . Diabetes Mother   . Diabetes Father    History  Substance Use Topics  . Smoking status: Never Smoker   . Smokeless tobacco: Never Used  . Alcohol Use: No   OB History    No data available     Review of Systems  10 Systems reviewed and are negative for acute change except as noted in the HPI.   Allergies  Review of patient's allergies indicates no known allergies.  Home Medications   Prior to Admission medications   Medication Sig Start Date End Date Taking? Authorizing Provider  aspirin-acetaminophen-caffeine (EXCEDRIN MIGRAINE) [phone] MG per tablet Take 1 tablet by mouth every 6 (six) hours as needed for headache. 08/05/14   Dorthula Matasiffany G Ersie Savino, PA-C   BP 107/81 mmHg   Pulse 81  Temp(Src) 98.1 F (36.7 C) (Oral)  Resp 18  SpO2 100%  LMP  (LMP Unknown) Physical Exam  Constitutional: She is oriented to person, place, and time. She appears well-developed and well-nourished. No distress.  HENT:  Head: Normocephalic and atraumatic.  Eyes: Pupils are equal, round, and reactive to light.  Neck: Normal range of motion. Neck supple.  Cardiovascular: Normal rate and regular rhythm.   Pulmonary/Chest: Effort normal.  Abdominal: Soft.  Neurological: She is alert and oriented to person, place, and time. She has normal strength. No cranial nerve deficit or sensory deficit. She displays a negative Romberg sign.  Cranial nerves II-VIII and X-XII evaluated and show no deficits. Pt alert and oriented x 3 Upper and lower extremity strength is symmetrical and physiologic Normal muscular tone No facial droop Coordination intact, no limb ataxia, finger-nose-finger normal Rapid alternating movements normal No pronator drift  Skin: Skin is warm and dry.  Nursing note and vitals reviewed.   ED Course  Procedures (including critical care time) Labs Review Labs Reviewed - No data to display  Imaging Review No results found.   EKG Interpretation None      MDM   Final diagnoses:  Nonintractable headache, unspecified chronicity pattern, unspecified headache type    Medications  sodium chloride 0.9 % bolus 1,000 mL (1,000 mLs Intravenous New Bag/Given 08/05/14 2032)  diphenhydrAMINE (BENADRYL) injection 25 mg (25 mg Intravenous Given 08/05/14 2135)  dexamethasone (DECADRON) injection 10 mg (10 mg Intravenous Given 08/05/14 2135)  metoCLOPramide (  REGLAN) injection 10 mg (10 mg Intravenous Given 08/05/14 2135)    Patients headache relieved with the migraine cocktail.  Presentation is non concerning for [facility], [facility], Meningitis, or temporal arteritis. Pt is afebrile with no focal neuro deficits, nuchal rigidity, or change in vision. The patient denies any symptoms  of neurological impairment or TIA's; no amaurosis, diplopia, dysphasia, or unilateral disturbance of motor or sensory function. No loss of balance or vertigo.  Will give RX for Excedrin Migraine and she can follow-up with PCP.  23 y.o.Debra Levy's evaluation in the Emergency Department is complete. It has been determined that no acute conditions requiring further emergency intervention are present at this time. The patient/guardian have been advised of the diagnosis and plan. We have discussed signs and symptoms that warrant return to the ED, such as changes or worsening in symptoms.  Vital signs are stable at discharge. Filed Vitals:   08/05/14 2130  BP: 107/81  Pulse: 81  Temp:   Resp:    Patient/guardian has voiced understanding and agreed to follow-up with the PCP or specialist.      Dorthula Matas, PA-C 08/05/14 2231  Donnetta Hutching, Levy 08/05/14 2348

## 2014-08-05 NOTE — ED Notes (Signed)
Pt reports having severe headache for several days and no relief with tylenol. Pt having sensitivity to light but denies n/v.

## 2014-08-05 NOTE — Discharge Instructions (Signed)

## 2014-08-08 ENCOUNTER — Encounter (HOSPITAL_COMMUNITY): Payer: Self-pay | Admitting: *Deleted

## 2014-08-08 ENCOUNTER — Emergency Department (HOSPITAL_COMMUNITY): Payer: BLUE CROSS/BLUE SHIELD

## 2014-08-08 ENCOUNTER — Emergency Department (HOSPITAL_COMMUNITY)
Admission: EM | Admit: 2014-08-08 | Discharge: 2014-08-09 | Disposition: A | Discharge status: Home / Self Care | Payer: BLUE CROSS/BLUE SHIELD | Attending: Emergency Medicine | Admitting: Emergency Medicine

## 2014-08-08 DIAGNOSIS — J45909 Unspecified asthma, uncomplicated: Secondary | ICD-10-CM | POA: Diagnosis not present

## 2014-08-08 DIAGNOSIS — R51 Headache: Secondary | ICD-10-CM | POA: Insufficient documentation

## 2014-08-08 DIAGNOSIS — R519 Headache, unspecified: Secondary | ICD-10-CM

## 2014-08-08 DIAGNOSIS — E669 Obesity, unspecified: Secondary | ICD-10-CM | POA: Insufficient documentation

## 2014-08-08 DIAGNOSIS — R42 Dizziness and giddiness: Secondary | ICD-10-CM | POA: Diagnosis not present

## 2014-08-08 DIAGNOSIS — H53149 Visual discomfort, unspecified: Secondary | ICD-10-CM | POA: Insufficient documentation

## 2014-08-08 MED ORDER — DEXAMETHASONE SODIUM PHOSPHATE 10 MG/ML IJ SOLN
10.0000 mg | Freq: Once | INTRAMUSCULAR | Status: DC
Start: 2014-08-08 — End: 2014-08-08
  Filled 2014-08-08: qty 1

## 2014-08-08 MED ORDER — METOCLOPRAMIDE HCL 5 MG/ML IJ SOLN
10.0000 mg | Freq: Once | INTRAMUSCULAR | Status: DC
  Filled 2014-08-08: qty 2

## 2014-08-08 MED ORDER — DIPHENHYDRAMINE HCL 50 MG/ML IJ SOLN
25.0000 mg | Freq: Once | INTRAMUSCULAR | Status: DC
  Filled 2014-08-08: qty 1

## 2014-08-08 MED ORDER — SODIUM CHLORIDE 0.9 % IV BOLUS (SEPSIS)
1000.0000 mL | Freq: Once | INTRAVENOUS | Status: AC
  Administered 2014-08-08: 1000 mL via INTRAVENOUS

## 2014-08-08 NOTE — ED Provider Notes (Signed)
CSN: [id number]     Arrival date & time 08/08/14  2231 History   First MD Initiated Contact with Patient 08/08/14 2248     Chief Complaint  Patient presents with  . Headache     (Consider location/radiation/quality/duration/timing/severity/associated sxs/prior Treatment) Patient is a 24 y.o. female presenting with headaches. The history is provided by the patient and medical records.  Headache Associated symptoms: dizziness and photophobia     This is a 24 year old female with past medical history significant for asthma, presenting to the ED for headache. She patient was seen in the ED 3 days ago for the same, states headache improved with medication in the ED but never completely resolved. She states since returning home headache has gotten progressively worse. Headache localized to the top of her head and back of her scalp. She denies any neck pain. She notes intermittent dizziness, lightheadedness, photophobia, and phonophobia, nausea, or vomiting. She denies any tinnitus, changes in speech, confusion, or visual disturbance.  Patient has continued taking excedrin migraine without improvement.  Patient does not have a personal history of migraines, however mother developed migraines at a similar age.  No reported fever, chills.  VSS on arrival.  Past Medical History  Diagnosis Date  . Asthma   . Obesity    History reviewed. No pertinent past surgical history. Family History  Problem Relation Age of Onset  . Diabetes Mother   . Diabetes Father    History  Substance Use Topics  . Smoking status: Never Smoker   . Smokeless tobacco: Never Used  . Alcohol Use: No   OB History    No data available     Review of Systems  Eyes: Positive for photophobia.  Neurological: Positive for dizziness, light-headedness and headaches.  All other systems reviewed and are negative.     Allergies  Review of patient's allergies indicates no known allergies.  Home Medications   Prior to  Admission medications   Medication Sig Start Date End Date Taking? Authorizing Provider  aspirin-acetaminophen-caffeine (EXCEDRIN MIGRAINE) [phone] MG per tablet Take 1 tablet by mouth every 6 (six) hours as needed for headache. 08/05/14   Dorthula Matas, PA-C   BP 107/76 mmHg  Pulse 88  Temp(Src) 98.3 F (36.8 C) (Oral)  Resp 16  Ht  (1.626 m)  Wt 212 lb (96.163 kg)  BMI 36.37 kg/m2  SpO2 98%  LMP 06/07/2014   Physical Exam  Constitutional: She is oriented to person, place, and time. She appears well-developed and well-nourished. No distress.  HENT:  Head: Normocephalic and atraumatic.  Mouth/Throat: Oropharynx is clear and moist.  Eyes: Conjunctivae and EOM are normal. Pupils are equal, round, and reactive to light.  Neck: Normal range of motion. Neck supple.  No meningismus  Cardiovascular: Normal rate, regular rhythm and normal heart sounds.   Pulmonary/Chest: Effort normal and breath sounds normal. No respiratory distress. She has no wheezes.  Abdominal: Soft. Bowel sounds are normal. There is no tenderness. There is no guarding.  Musculoskeletal: Normal range of motion.  Neurological: She is alert and oriented to person, place, and time.  AAOx3, answering questions appropriately; equal strength UE and LE bilaterally; CN grossly intact; moves all extremities appropriately without ataxia; no focal neuro deficits or facial asymmetry appreciated  Skin: Skin is warm and dry. She is not diaphoretic.  Psychiatric: She has a normal mood and affect.  Nursing note and vitals reviewed.   ED Course  Procedures (including critical care time) Labs Review Labs Reviewed -  No data to display  Imaging Review Ct Head Wo Contrast  08/09/2014   CLINICAL DATA:  Left posterior headache intermittently for 1/2 weeks.  EXAM: CT HEAD WITHOUT CONTRAST  TECHNIQUE: Contiguous axial images were obtained from the base of the skull through the vertex without intravenous contrast.  COMPARISON:   None.  FINDINGS: There is no intracranial hemorrhage, mass or evidence of acute infarction. The brain and CSF spaces appear unremarkable.  The bony structures are intact. The visible portions of the paranasal sinuses are clear.  IMPRESSION: Normal brain   Electronically Signed   By: Ellery Plunkaniel R Mitchell M.D.   On: 08/09/2014 00:08     EKG Interpretation None      MDM   Final diagnoses:  Headache   24 year old female with headache. Second ED visit in 3 days for the same. On exam, patient is afebrile and nontoxic in appearance. She has no clinical signs of meningitis. Her neurologic exam is nonfocal. Her constellation of symptoms are consistent with migraine, however mother is concerned for potential intracranial process. Head CT will be obtained. Patient given toradol and compazine.  Head CT negative.  After meds headache has completely resolved, patient resting comfortably.  VS remain stable. Neurologic exam remains non-focal.  Patient will be d/c home with close PCP FU.  Rx fioricet.  Discussed plan with patient, he/she acknowledged understanding and agreed with plan of care.  Return precautions given for new or worsening symptoms.  Garlon HatchetLisa M Wali Reinheimer, PA-C 08/09/14 0101  Elwin MochaBlair Walden, MD 08/09/14 [phone]

## 2014-08-08 NOTE — ED Notes (Signed)
The pt is c/o a headache that she has had since she was here last 2 14.  Her headache has   Gotten worse

## 2014-08-08 NOTE — ED Notes (Addendum)
Pt states that she got reglan, decadron and benedryl the last time she was here, and states that these medications did not help her pain. Allyne GeeSanders, PA notified.

## 2014-08-09 MED ORDER — KETOROLAC TROMETHAMINE 30 MG/ML IJ SOLN
30.0000 mg | Freq: Once | INTRAMUSCULAR | Status: AC
Start: 2014-08-09 — End: 2014-08-09
  Administered 2014-08-09: 30 mg via INTRAVENOUS
  Filled 2014-08-09: qty 1

## 2014-08-09 MED ORDER — BUTALBITAL-APAP-CAFFEINE 50-325-40 MG PO TABS: 1.0000 | ORAL_TABLET | Freq: Four times a day (QID) | ORAL | Status: DC | PRN

## 2014-08-09 MED ORDER — PROCHLORPERAZINE EDISYLATE 5 MG/ML IJ SOLN
10.0000 mg | Freq: Once | INTRAMUSCULAR | Status: AC
  Administered 2014-08-09: 10 mg via INTRAVENOUS
  Filled 2014-08-09: qty 2

## 2014-08-09 NOTE — Discharge Instructions (Signed)
Take the prescribed medication as directed. °Follow-up with your primary care physician. °Return to the ED for new or worsening symptoms. ° °

## 2014-08-14 ENCOUNTER — Ambulatory Visit: Payer: Self-pay | Admitting: Internal Medicine

## 2014-08-18 ENCOUNTER — Ambulatory Visit (INDEPENDENT_AMBULATORY_CARE_PROVIDER_SITE_OTHER): Payer: BLUE CROSS/BLUE SHIELD | Admitting: Internal Medicine

## 2014-08-18 ENCOUNTER — Encounter: Payer: Self-pay | Admitting: Internal Medicine

## 2014-08-18 VITALS — BP 114/77 | HR 77 | Temp 98.1°F | Ht 63.0 in | Wt 228.1 lb

## 2014-08-18 DIAGNOSIS — J45909 Unspecified asthma, uncomplicated: Secondary | ICD-10-CM | POA: Insufficient documentation

## 2014-08-18 DIAGNOSIS — Z23 Encounter for immunization: Secondary | ICD-10-CM

## 2014-08-18 DIAGNOSIS — J4520 Mild intermittent asthma, uncomplicated: Secondary | ICD-10-CM

## 2014-08-18 DIAGNOSIS — G43909 Migraine, unspecified, not intractable, without status migrainosus: Secondary | ICD-10-CM

## 2014-08-18 MED ORDER — PROPRANOLOL HCL ER 60 MG PO CP24: 60.0000 mg | ORAL_CAPSULE | Freq: Every day | ORAL | Status: DC

## 2014-08-18 MED ORDER — BUTALBITAL-APAP-CAFFEINE 50-325-40 MG PO TABS
1.0000 | ORAL_TABLET | Freq: Two times a day (BID) | ORAL | Status: DC | PRN
Start: 2014-08-18 — End: 2014-10-06

## 2014-08-18 NOTE — Assessment & Plan Note (Signed)
No recent symptoms of asthma. Not on any medication. Will set up PFTs as [facility].

## 2014-08-18 NOTE — Progress Notes (Addendum)
Subjective:    Patient ID: Debra Levy, female    DOB: 09-15-1990, 24 y.o.   MRN: [id number]  HPI 23YO female presents for follow up.  Migraines - Continues to have migraines almost every day. Has been to ED because of severe headache pain. CT head was normal. Has been using Butalbital with slight improvement. Has nausea and impaired balance with headaches. Headache described as aching on one side or other. Positive photophobia and phonophobia. No change with foods. No change with menses.  Needs to have PFTs completed [facility]. Not taking anything for asthma. No recent exacerbations.   Past medical, surgical, family and social history per today's encounter.  Review of Systems  Constitutional: Negative for fever, chills, appetite change, fatigue and unexpected weight change.  Eyes: Positive for photophobia and visual disturbance.  Respiratory: Negative for cough, chest tightness, shortness of breath and wheezing.   Cardiovascular: Negative for chest pain and leg swelling.  Gastrointestinal: Positive for nausea. Negative for vomiting, abdominal pain, diarrhea and constipation.  Skin: Negative for color change and rash.  Neurological: Positive for headaches.  Hematological: Negative for adenopathy. Does not bruise/bleed easily.  Psychiatric/Behavioral: Negative for dysphoric mood. The patient is not nervous/anxious.        Objective:    BP 114/77 mmHg  Pulse 77  Temp(Src) 98.1 F (36.7 C) (Oral)  Ht 5\' 3"  (1.6 m)  Wt 228 lb 2 oz (103.477 kg)  BMI 40.42 kg/m2  SpO2 99%  LMP 06/07/2014 Physical Exam  Constitutional: She is oriented to person, place, and time. She appears well-developed and well-nourished. No distress.  HENT:  Head: Normocephalic and atraumatic.  Right Ear: External ear normal.  Left Ear: External ear normal.  Nose: Nose normal.  Mouth/Throat: Oropharynx is clear and moist. No oropharyngeal exudate.  Eyes: Conjunctivae are normal. Pupils  are equal, round, and reactive to light. Right eye exhibits no discharge. Left eye exhibits no discharge. No scleral icterus.  Neck: Normal range of motion. Neck supple. No tracheal deviation present. No thyromegaly present.  Cardiovascular: Normal rate, regular rhythm, normal heart sounds and intact distal pulses.  Exam reveals no gallop and no friction rub.   No murmur heard. Pulmonary/Chest: Effort normal and breath sounds normal. No respiratory distress. She has no wheezes. She has no rales. She exhibits no tenderness.  Musculoskeletal: Normal range of motion. She exhibits no edema or tenderness.  Lymphadenopathy:    She has no cervical adenopathy.  Neurological: She is alert and oriented to person, place, and time. No cranial nerve deficit. She exhibits normal muscle tone. Coordination normal.  Skin: Skin is warm and dry. No rash noted. She is not diaphoretic. No erythema. No pallor.  Psychiatric: She has a normal mood and affect. Her behavior is normal. Judgment and thought content normal.          Assessment & Plan:   Problem List Items Addressed This Visit      Unprioritized   Asthma, chronic    No recent symptoms of asthma. Not on any medication. Will set up PFTs as per requirement [facility].      Relevant Orders   Pulmonary function test   Migraine - Primary    Daily migraine. CT head normal and reviewed. Will add Propranolol as preventative med. Discussed potential benefits and risks of this medication. Continue prn Butalbital. Follow up 4 weeks and prn.      Relevant Medications   propranolol (INDERAL LA) 24 hr capsule  butalbital-acetaminophen-caffeine (FIORICET) 50-325-40 MG per tablet       Return in about 4 weeks (around 09/15/2014) for Recheck.

## 2014-08-18 NOTE — Assessment & Plan Note (Signed)
Daily migraine. CT head normal and reviewed. Will add Propranolol as preventative med. Discussed potential benefits and risks of this medication. Continue prn Butalbital. Follow up 4 weeks and prn.

## 2014-08-18 NOTE — Progress Notes (Signed)
Pre visit review using our clinic review tool, if applicable. No additional management support is needed unless otherwise documented below in the visit note. 

## 2014-08-18 NOTE — Patient Instructions (Signed)
Start Propranolol 60mg  daily to help control migraines.  Continue Butalbital as needed for headache.  Follow up in 4 weeks.  We will set up lung function testing.

## 2014-08-25 ENCOUNTER — Ambulatory Visit (HOSPITAL_COMMUNITY)
Admission: RE | Admit: 2014-08-25 | Discharge: 2014-08-25 | Disposition: A | Discharge status: Home / Self Care | Payer: BLUE CROSS/BLUE SHIELD | Source: Ambulatory Visit | Attending: Internal Medicine | Admitting: Internal Medicine

## 2014-08-25 DIAGNOSIS — J4520 Mild intermittent asthma, uncomplicated: Secondary | ICD-10-CM | POA: Insufficient documentation

## 2014-08-25 LAB — PULMONARY FUNCTION TEST
DL/VA % pred: 129 %
DL/VA: 6.07 ml/min/mmHg/L
DLCO UNC % PRED: 96 %
DLCO UNC: 22.01 ml/min/mmHg
DLCO cor % pred: 96 %
DLCO cor: 22.01 ml/min/mmHg
FEF 25-75 POST: 4.46 L/s
FEF 25-75 PRE: 4.4 L/s
FEF2575-%Change-Post: 1 %
FEF2575-%PRED-PRE: 128 %
FEF2575-%Pred-Post: 130 %
FEV1-%Change-Post: 0 %
FEV1-%PRED-PRE: 92 %
FEV1-%Pred-Post: 93 %
FEV1-Post: 2.59 L
FEV1-Pre: 2.57 L
FEV1FVC-%Change-Post: 0 %
FEV1FVC-%Pred-Pre: 105 %
FEV6-%CHANGE-POST: 0 %
FEV6-%PRED-PRE: 89 %
FEV6-%Pred-Post: 89 %
FEV6-POST: 2.82 L
FEV6-PRE: 2.81 L
FEV6FVC-%PRED-POST: 100 %
FEV6FVC-%Pred-Pre: 100 %
FVC-%Change-Post: 0 %
FVC-%PRED-POST: 89 %
FVC-%PRED-PRE: 88 %
FVC-POST: 2.82 L
FVC-PRE: 2.81 L
POST FEV6/FVC RATIO: 100 %
Post FEV1/FVC ratio: 92 %
Pre FEV1/FVC ratio: 92 %
Pre FEV6/FVC Ratio: 100 %
RV % pred: 110 %
RV: 1.31 L
TLC % pred: 87 %
TLC: 4.3 L

## 2014-08-25 MED ORDER — ALBUTEROL SULFATE (2.5 MG/3ML) 0.083% IN NEBU
2.5000 mg | INHALATION_SOLUTION | Freq: Once | RESPIRATORY_TRACT | Status: AC
  Administered 2014-08-25: 2.5 mg via RESPIRATORY_TRACT

## 2014-09-03 ENCOUNTER — Telehealth: Payer: Self-pay

## 2014-09-03 NOTE — Telephone Encounter (Signed)
The patient called and stated she is still having migraine symptoms.  She stated the medicine she was prescribed is helping, but not completely eliminating the migraines.  She is hoping a stronger dose of the medication can be called in.  Cell callback - [phone]

## 2014-09-03 NOTE — Telephone Encounter (Signed)
Please see below.

## 2014-09-03 NOTE — Telephone Encounter (Signed)
She needs to be seen in a visit and evaluated. 

## 2014-09-03 NOTE — Telephone Encounter (Signed)
Pt scheduled for 4.8.16 at 8am

## 2014-09-09 ENCOUNTER — Ambulatory Visit: Payer: BLUE CROSS/BLUE SHIELD | Admitting: Internal Medicine

## 2014-09-10 ENCOUNTER — Ambulatory Visit: Payer: BLUE CROSS/BLUE SHIELD | Admitting: Internal Medicine

## 2014-09-15 ENCOUNTER — Ambulatory Visit: Payer: BLUE CROSS/BLUE SHIELD | Admitting: Internal Medicine

## 2014-09-24 ENCOUNTER — Telehealth: Payer: Self-pay | Admitting: *Deleted

## 2014-09-24 NOTE — Telephone Encounter (Signed)
Pt called requesting refill of migraine medication.  Reviewed chart advised pt that on 3.1.16 butalbital-acetaminophen-caffeine (FIORICET) 50-325-40 MG per tablet was written for 30 days with 1 additional refill. Pt further stated she wanted additional medication, pt advised she would need to be seen at 4.12.16 appoint.

## 2014-09-25 ENCOUNTER — Ambulatory Visit: Payer: BLUE CROSS/BLUE SHIELD | Admitting: Internal Medicine

## 2014-09-29 ENCOUNTER — Ambulatory Visit: Payer: BLUE CROSS/BLUE SHIELD | Admitting: Internal Medicine

## 2014-10-06 ENCOUNTER — Ambulatory Visit (INDEPENDENT_AMBULATORY_CARE_PROVIDER_SITE_OTHER): Payer: BLUE CROSS/BLUE SHIELD | Admitting: Internal Medicine

## 2014-10-06 ENCOUNTER — Encounter: Payer: Self-pay | Admitting: Internal Medicine

## 2014-10-06 VITALS — BP 115/74 | HR 108 | Temp 98.2°F | Ht 63.0 in | Wt 224.0 lb

## 2014-10-06 DIAGNOSIS — G43101 Migraine with aura, not intractable, with status migrainosus: Secondary | ICD-10-CM

## 2014-10-06 MED ORDER — NORTRIPTYLINE HCL 25 MG PO CAPS: 25.0000 mg | ORAL_CAPSULE | Freq: Every day | ORAL | Status: DC

## 2014-10-06 MED ORDER — SUMATRIPTAN SUCCINATE 50 MG PO TABS: 50.0000 mg | ORAL_TABLET | ORAL | Status: DC | PRN

## 2014-10-06 NOTE — Patient Instructions (Signed)
Stop Propranolol.  Start Pamelor 25mg  at bedtime daily.  Start Imitrex 50mg  at onset of headache. You may repeat this dose once in 1-2 hr if persistent headache.  We will set up an MRI brain.

## 2014-10-06 NOTE — Assessment & Plan Note (Signed)
Migraines persistent despite starting Propranolol. Will change to Pamelor. Will add Imitrex for acute headache treatment. Will set up MRI brain given persistent symptoms. Follow up in 4 weeks and prn.

## 2014-10-06 NOTE — Progress Notes (Signed)
Pre visit review using our clinic review tool, if applicable. No additional management support is needed unless otherwise documented below in the visit note. 

## 2014-10-06 NOTE — Progress Notes (Signed)
Subjective:    Patient ID: Debra Levy, female    DOB: [birth year], 24 y.o.   MRN: [id number]  HPI  23YO female presents for follow up.  Last seen 3/1. Started on Propranolol to help prevent migraines.  Taking Propranolol daily. Continues to have headaches most days of week. However, less intense. Some are still severe, wake her from sleep, 10/10. Described as mostly left sided, by ear, however pain can be diffuse. Having yellow streaks in left eye with headaches. Some nausea and lightheadedness with headache. Some photo and phonophobia.  Past medical, surgical, family and social history per today's encounter.  Review of Systems  Constitutional: Negative for fever, chills, appetite change, fatigue and unexpected weight change.  Eyes: Positive for photophobia and visual disturbance.  Respiratory: Negative for shortness of breath.   Cardiovascular: Negative for chest pain and leg swelling.  Gastrointestinal: Positive for nausea. Negative for vomiting, abdominal pain, diarrhea and constipation.  Skin: Negative for color change and rash.  Neurological: Positive for light-headedness and headaches. Negative for seizures, speech difficulty, weakness and numbness.  Hematological: Negative for adenopathy. Does not bruise/bleed easily.  Psychiatric/Behavioral: Negative for dysphoric mood. The patient is not nervous/anxious.        Objective:    BP 115/74 mmHg  Pulse 108  Temp(Src) 98.2 F (36.8 C) (Oral)  Ht 5\' 3"  (1.6 m)  Wt 224 lb (101.606 kg)  BMI 39.69 kg/m2  SpO2 97%  LMP 09/17/2014 Physical Exam  Constitutional: She is oriented to person, place, and time. She appears well-developed and well-nourished. No distress.  HENT:  Head: Normocephalic and atraumatic.  Right Ear: External ear normal.  Left Ear: External ear normal.  Nose: Nose normal.  Mouth/Throat: Oropharynx is clear and moist. No oropharyngeal exudate.  Eyes: Conjunctivae are normal. Pupils are equal, round,  and reactive to light. Right eye exhibits no discharge. Left eye exhibits no discharge. No scleral icterus.  Neck: Normal range of motion. Neck supple. No tracheal deviation present. No thyromegaly present.  Cardiovascular: Normal rate, regular rhythm, normal heart sounds and intact distal pulses.  Exam reveals no gallop and no friction rub.   No murmur heard. Pulmonary/Chest: Effort normal and breath sounds normal. No respiratory distress. She has no wheezes. She has no rales. She exhibits no tenderness.  Musculoskeletal: Normal range of motion. She exhibits no edema or tenderness.  Lymphadenopathy:    She has no cervical adenopathy.  Neurological: She is alert and oriented to person, place, and time. No cranial nerve deficit. She exhibits normal muscle tone. Coordination normal.  Skin: Skin is warm and dry. No rash noted. She is not diaphoretic. No erythema. No pallor.  Psychiatric: She has a normal mood and affect. Her behavior is normal. Judgment and thought content normal.          Assessment & Plan:  Over 25min of which >50% spent in face-to-face contact with patient discussing plan of care  Problem List Items Addressed This Visit      Unprioritized   Migraine - Primary    Migraines persistent despite starting Propranolol. Will change to Pamelor. Will add Imitrex for acute headache treatment. Will set up MRI brain given persistent symptoms. Follow up in 4 weeks and prn.      Relevant Medications   nortriptyline (PAMELOR) 25 MG capsule   SUMAtriptan (IMITREX) 50 MG tablet   Other Relevant Orders   MR Brain W Wo Contrast       Return in about 4 weeks (around  11/03/2014) for Recheck.

## 2014-10-07 ENCOUNTER — Telehealth: Payer: Self-pay | Admitting: Internal Medicine

## 2014-10-07 NOTE — Telephone Encounter (Signed)
[facility] Day - Client TELEPHONE ADVICE RECORD [facility] Patient Name: Debra Levy DOB: 1991-04-17 Initial Comment Caller States grand-daughter has a bad migraine since last night and is not eating. Nurse Assessment Nurse: Lane HackerHarley, RN, Windy Date/Time Lamount Cohen(Eastern Time): 10/07/2014 16:10:[id number]:28:24 PM Confirm and document reason for call. If symptomatic, describe symptoms. ---Caller states she has had a severe migraine since last night and less of an appetite. -- Pt was seen by MD yesterday as she has had a migraine for several days, MD stopped Propranolol and Fioricet. Pt was given Nortriptyline 25 mg QHS, and Imitrex 50 mg take 1 tab every 2 hours PRN, and may repeat in 2 hours if HA persists. She took it just once last night as it made her tired and nauseated, and went to sleep. She just woke before calling now, rates HA pain 7/10. No vomiting/fever. 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 She read that she can not be on an antidepressant, and it would disqualify her from being in [employer]. She plans to call and verify, and if not she will start the Nortriptyline. If that is the case, are there other options? 2) Did the doctor want me to come back to see her today? She is aware of f/u within 4 wks, but advised by MD to call back if persists. RN advised that she take the Imitrex as prescribed and triaged again. Has the patient traveled out of the country within the last 30 days? ---No Does the patient require triage? ---Yes Related visit to physician within the last 2 weeks? ---Yes Does the PT have any chronic conditions? (i.e. diabetes, asthma, etc.) ---Yes List chronic conditions. ---Asthma, Migraines Did the patient indicate they were pregnant? ---No Guidelines Guideline Title Affirmed Question Affirmed Notes Headache Headache is a chronic symptom (recurrent or ongoing AND present > 4 weeks) Final Disposition User See PCP  within 2 Mikey CollegeWeeks Harley, Charity fundraiserN, Elvin SoWindy

## 2014-10-13 ENCOUNTER — Other Ambulatory Visit: Payer: Self-pay | Admitting: Internal Medicine

## 2014-10-13 ENCOUNTER — Telehealth: Payer: Self-pay | Admitting: *Deleted

## 2014-10-13 DIAGNOSIS — R51 Headache: Principal | ICD-10-CM

## 2014-10-13 DIAGNOSIS — R519 Headache, unspecified: Secondary | ICD-10-CM

## 2014-10-13 MED ORDER — ELETRIPTAN HYDROBROMIDE 40 MG PO TABS
40.0000 mg | ORAL_TABLET | Freq: Every day | ORAL | Status: DC | PRN
Start: 2014-10-13 — End: 2014-10-29

## 2014-10-13 NOTE — Telephone Encounter (Signed)
Pt notified and verbalized understanding. Rx sent to pharmacy by escript  

## 2014-10-13 NOTE — Telephone Encounter (Signed)
Pt called, wanting to know if there is anything else she can do for her migraines. Stopped Propanolol, started Nortryptyline as directed at last visit. Having to take both doses of imitrex with migraines. Does not completely resolve the pain. States she took one this morning and is due to take the second one at 4312. She has used all 10 that she was prescribed. Asking if it can be refilled (not sure insurance will cover that) or what she needs to do?

## 2014-10-13 NOTE — Telephone Encounter (Signed)
No. I would not recommend that she continue Imitrex. We can call in Relpax 40mg  daily prn #10.

## 2014-10-13 NOTE — Telephone Encounter (Signed)
Pt notified and  verbalized understanding. Agreeable to referral. [facility].  Advised insurance may not cover another refill of Imitrex. Advised I would send in refill if ok with Dr. Dan HumphreysWalker. Ok refill Imitrex?

## 2014-10-13 NOTE — Telephone Encounter (Signed)
Would she be willing to see neurology? I can place referral if she is willing to see them

## 2014-10-21 ENCOUNTER — Ambulatory Visit
Admission: RE | Admit: 2014-10-21 | Discharge: 2014-10-21 | Disposition: A | Discharge status: Home / Self Care | Payer: BLUE CROSS/BLUE SHIELD | Source: Ambulatory Visit | Attending: Internal Medicine | Admitting: Internal Medicine

## 2014-10-21 DIAGNOSIS — G43101 Migraine with aura, not intractable, with status migrainosus: Secondary | ICD-10-CM

## 2014-10-21 DIAGNOSIS — G43909 Migraine, unspecified, not intractable, without status migrainosus: Secondary | ICD-10-CM | POA: Insufficient documentation

## 2014-10-21 MED ORDER — GADOBENATE DIMEGLUMINE 529 MG/ML IV SOLN
20.0000 mL | Freq: Once | INTRAVENOUS | Status: AC
  Administered 2014-10-21: 20 mL via INTRAVENOUS

## 2014-10-27 ENCOUNTER — Ambulatory Visit: Payer: BLUE CROSS/BLUE SHIELD

## 2014-10-28 ENCOUNTER — Ambulatory Visit: Payer: BLUE CROSS/BLUE SHIELD

## 2014-10-29 ENCOUNTER — Other Ambulatory Visit: Payer: Self-pay | Admitting: *Deleted

## 2014-10-29 ENCOUNTER — Telehealth: Payer: Self-pay | Admitting: *Deleted

## 2014-10-29 MED ORDER — ELETRIPTAN HYDROBROMIDE 40 MG PO TABS: 40.0000 mg | ORAL_TABLET | Freq: Every day | ORAL | Status: DC | PRN

## 2014-10-29 NOTE — Telephone Encounter (Signed)
Printed and signed, Rx picked up by pt's mother

## 2014-10-29 NOTE — Telephone Encounter (Signed)
Patient called the triage line to wee if Dr. Dan HumphreysWalker had completed her prescriptions, Per prior note Raynelle FanningJulie completed and patients mother picked up the prescriptions.

## 2014-10-29 NOTE — Telephone Encounter (Signed)
Requesting refill on Relpax, last Rx 10/03/14 for 10 tablets.

## 2014-10-29 NOTE — Telephone Encounter (Signed)
Fine to refill 

## 2014-11-03 ENCOUNTER — Ambulatory Visit: Payer: BLUE CROSS/BLUE SHIELD

## 2014-11-05 ENCOUNTER — Ambulatory Visit: Payer: BLUE CROSS/BLUE SHIELD

## 2014-11-06 ENCOUNTER — Encounter: Payer: Self-pay | Admitting: Neurology

## 2014-11-06 ENCOUNTER — Ambulatory Visit (INDEPENDENT_AMBULATORY_CARE_PROVIDER_SITE_OTHER): Payer: BLUE CROSS/BLUE SHIELD | Admitting: Neurology

## 2014-11-06 ENCOUNTER — Telehealth: Payer: Self-pay | Admitting: Neurology

## 2014-11-06 VITALS — BP 140/86 | HR 86 | Resp 22 | Ht 63.0 in | Wt 233.9 lb

## 2014-11-06 DIAGNOSIS — G43709 Chronic migraine without aura, not intractable, without status migrainosus: Secondary | ICD-10-CM

## 2014-11-06 DIAGNOSIS — G43109 Migraine with aura, not intractable, without status migrainosus: Secondary | ICD-10-CM

## 2014-11-06 DIAGNOSIS — IMO0002 Reserved for concepts with insufficient information to code with codable children: Secondary | ICD-10-CM | POA: Insufficient documentation

## 2014-11-06 MED ORDER — NAPROXEN SODIUM 550 MG PO TABS: ORAL_TABLET | ORAL | Status: DC

## 2014-11-06 MED ORDER — NORTRIPTYLINE HCL 50 MG PO CAPS: 50.0000 mg | ORAL_CAPSULE | Freq: Every day | ORAL | Status: DC

## 2014-11-06 NOTE — Patient Instructions (Signed)
1.  Double up on the nortriptyline so you are taking two capsules (50mg ) at bedtime.  Since you will be done in 2 weeks, I placed a refill of 50mg  capsule to take at bedtime.  At the end of 4 weeks, call and we can adjust dose if needed. 2.  Continue Relpax.  Consider taking the Relpax with naproxen 500mg . 3.  Follow sleep hygiene tips, increase water intake, exercise 4.  Headache diary 5.  Follow up in 3 months.

## 2014-11-06 NOTE — Addendum Note (Signed)
Addended by: Franciso BendMCNEIL, Amiri Riechers M on: 11/06/2014 11:59 AM   Modules accepted: Orders

## 2014-11-06 NOTE — Progress Notes (Addendum)
NEUROLOGY CONSULTATION NOTE  Debra Levy MRN: [id number] DOB: 1990/09/26  Referring provider: Dr. Dan HumphreysWalker Primary care provider: Dr. Dan HumphreysWalker  Reason for consult:  migraine  HISTORY OF PRESENT ILLNESS: Debra Levy is a 24 year old right-handed female who presents for migraine.  Records, CT of head and MRI of brain reviewed.  She is accompanied by her mother who provides some history.  Onset:  Several months ago Location:  Top of head, sometimes temples Quality:  Squeezing/tight feeling Intensity:  Usually 6-7/10 (sometimes 10/10) Aura:  Yellow squiggly lines in vision (occurs with headache) Prodrome:  no Associated symptoms:  Nausea, photophobia, phonophobia, hard to focus Duration:  All day (quickly with Relpax, except headache returns several hours later) Frequency:  daily Triggers/exacerbating factors:  Light, fried food, tomatoes Relieving factors:  Relpax, heat applied to head Activity:  Cannot function about once a week  Past abortive therapy:  Fioricet, Excedrin, sumatriptan Past preventative therapy:  propranolol ER 60mg   Current abortive therapy:  Relpax 40mg  Current preventative therapy:  nortriptyline 25mg   CT of head performed on 08/09/14 was normal.  MRI of brain with and without contrast performed on 10/21/14 was unremarkable.  Caffeine:  soda Alcohol:  no Smoker:  no Diet:  Salads, fruit. Exercise:  Not as much since had ankle surgery Depression/stress:  no Sleep hygiene:  Does not sleep through the night Family history of headache:  mom  PAST MEDICAL HISTORY: Past Medical History  Diagnosis Date  . Asthma   . Obesity   . Headache     PAST SURGICAL HISTORY: No past surgical history on file.  MEDICATIONS: Current Outpatient Prescriptions on File Prior to Visit  Medication Sig Dispense Refill  . eletriptan (RELPAX) 40 MG tablet Take 1 tablet (40 mg total) by mouth daily as needed for migraine or headache. 10 tablet 0   No current  facility-administered medications on file prior to visit.    ALLERGIES: No Known Allergies  FAMILY HISTORY: Family History  Problem Relation Age of Onset  . Kidney failure Mother   . Diabetes Father   . Asthma Mother   . Atrial fibrillation Mother   . Hypertension Father   . Sleep apnea Father   . Rheum arthritis Maternal Grandmother   . Diabetes Maternal Grandfather   . Diabetes Paternal Grandmother   . Hypertension Paternal Grandmother     SOCIAL HISTORY: History   Social History  . Marital Status: Single    Spouse Name: N/A  . Number of Children: N/A  . Years of Education: N/A   Occupational History  . Not on file.   Social History Main Topics  . Smoking status: Never Smoker   . Smokeless tobacco: Never Used  . Alcohol Use: No  . Drug Use: No  . Sexual Activity: No   Other Topics Concern  . Not on file   Social History Narrative   [place] in [place] with [place]. Taking online college courses. 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      Diet: regular   Exercise: limited after injury    REVIEW OF SYSTEMS: Constitutional: No fevers, chills, or sweats, no generalized fatigue, change in appetite Eyes: No visual changes, double vision, eye pain Ear, nose and throat: No hearing loss, ear pain, nasal congestion, sore throat Cardiovascular: No chest pain, palpitations Respiratory:  No shortness of breath at rest or with exertion, wheezes GastrointestinaI: No nausea, vomiting, diarrhea, abdominal pain, fecal incontinence Genitourinary:  No dysuria, urinary retention or frequency Musculoskeletal:  No neck pain, back pain Integumentary: No rash, pruritus, skin lesions Neurological: as above Psychiatric: No depression, insomnia, anxiety Endocrine: No palpitations, fatigue, diaphoresis, mood swings, change in appetite, change in weight, increased thirst Hematologic/Lymphatic:  No anemia, purpura, petechiae. Allergic/Immunologic: no itchy/runny eyes, nasal  congestion, recent allergic reactions, rashes  PHYSICAL EXAM: Filed Vitals:   11/06/14 1029  BP: 140/86  Pulse: 86  Resp: 22   General: No acute distress Head:  Normocephalic/atraumatic Eyes:  fundi unremarkable, without vessel changes, exudates, hemorrhages or papilledema. Neck: supple, no paraspinal tenderness, full range of motion Back: No paraspinal tenderness Heart: regular rate and rhythm Lungs: Clear to auscultation bilaterally. Vascular: No carotid bruits. Neurological Exam: Mental status: alert and oriented to person, place, and time, recent and remote memory intact, fund of knowledge intact, attention and concentration intact, speech fluent and not dysarthric, language intact. Cranial nerves: CN I: not tested CN II: pupils equal, round and reactive to light, visual fields intact, fundi unremarkable, without vessel changes, exudates, hemorrhages or papilledema. CN III, IV, VI:  full range of motion, no nystagmus, no ptosis CN V: facial sensation intact CN VII: upper and lower face symmetric CN VIII: hearing intact CN IX, X: gag intact, uvula midline CN XI: sternocleidomastoid and trapezius muscles intact CN XII: tongue midline Bulk & Tone: normal, no fasciculations. Motor:  5/5 throughout Sensation:  Temperature and vibration intact Deep Tendon Reflexes:  2+ throughout, toes downgoing Finger to nose testing:  No dysmetria Heel to shin:  No dysmetria Gait:  Normal station and stride.  Able to turn and walk in tandem. Romberg negative.  IMPRESSION: Chronic migraine; migraine with visual aura  PLAN: 1.  Increase nortriptyline to 50mg  at bedtime 2.  Relpax for abortive therapy.  Also consider taking naproxen sodium 550mg  along with the Relpax 3.  Sleep hygiene, exercise, stop soda 4.  Headache diary 5.  Call in 4 weeks with update. 6.  Follow up in 3 months.  Thank you for allowing me to take part in the care of this patient.  Shon MilletAdam Aruna Nestler, DO  CC:  Ronna PolioJennifer  Walker, MD

## 2014-11-08 NOTE — Telephone Encounter (Signed)
error 

## 2014-11-17 ENCOUNTER — Ambulatory Visit: Payer: BLUE CROSS/BLUE SHIELD | Admitting: Internal Medicine

## 2014-11-17 DIAGNOSIS — Z0289 Encounter for other administrative examinations: Secondary | ICD-10-CM

## 2014-11-20 ENCOUNTER — Encounter: Payer: Self-pay | Admitting: Internal Medicine

## 2014-11-26 ENCOUNTER — Ambulatory Visit: Payer: BLUE CROSS/BLUE SHIELD | Admitting: Internal Medicine

## 2014-12-07 ENCOUNTER — Ambulatory Visit: Payer: BLUE CROSS/BLUE SHIELD | Admitting: Internal Medicine

## 2014-12-07 ENCOUNTER — Encounter: Payer: Self-pay | Admitting: *Deleted

## 2014-12-07 ENCOUNTER — Telehealth: Payer: Self-pay

## 2014-12-07 NOTE — Telephone Encounter (Signed)
Patient dismissal process started in HIM 

## 2014-12-08 ENCOUNTER — Telehealth: Payer: Self-pay | Admitting: Internal Medicine

## 2014-12-08 NOTE — Telephone Encounter (Signed)
Patient dismissed from [facility] by Ronna Polio, MD  effective December 07, 2014. Dismissal letter sent out by certified / registered mail on December 08, 2014. [facility]  Certified dismissal letter returned as undeliverable, unclaimed, return to sender after three attempts by [employer]. Letter placed in another envelope and resent as 1st class mail which does not require a signature. 01/01/15 DAJ

## 2014-12-09 ENCOUNTER — Other Ambulatory Visit: Payer: Self-pay | Admitting: Internal Medicine

## 2014-12-11 ENCOUNTER — Other Ambulatory Visit: Payer: Self-pay | Admitting: Neurology

## 2014-12-11 NOTE — Telephone Encounter (Signed)
Rx sent 

## 2014-12-15 ENCOUNTER — Telehealth: Payer: Self-pay | Admitting: Internal Medicine

## 2014-12-15 NOTE — Telephone Encounter (Signed)
Pt has been discharged.  Please review as discussed and advise.

## 2014-12-15 NOTE — Telephone Encounter (Signed)
Pt wanted to know is she could get immunization shots for school. Please advise.msn

## 2014-12-22 ENCOUNTER — Telehealth: Payer: Self-pay | Admitting: Neurology

## 2014-12-22 NOTE — Telephone Encounter (Signed)
Pt called in regards to her medication Nortriptyline/Dawn CB# [phone]

## 2014-12-24 ENCOUNTER — Telehealth: Payer: Self-pay | Admitting: Neurology

## 2014-12-24 NOTE — Telephone Encounter (Signed)
RX for Nortriptaline and Relpax have been called to pharmacy

## 2014-12-24 NOTE — Telephone Encounter (Signed)
Pt called and stated she was supposed to have a prescription of Nortryptaline called in and nothing was sent over yet/Dawn CB# [phone]

## 2014-12-24 NOTE — Telephone Encounter (Signed)
Patient is aware that  Prescription has been called in however she will not be able to pick it up until after the  14th  Of July

## 2014-12-24 NOTE — Telephone Encounter (Signed)
Pt called and said she can't pick her meds up until the 14th/Dawn CB#[phone]

## 2014-12-29 ENCOUNTER — Telehealth: Payer: Self-pay | Admitting: Neurology

## 2014-12-29 NOTE — Telephone Encounter (Signed)
Pt called in regards to her prescription Relpax, she said she was supposed to take it everyday but the prescription is not enough for every day/Dawn CB# [phone]

## 2014-12-29 NOTE — Telephone Encounter (Signed)
Patient states she is not able to pick up Relpax I advised her that if it is to early the insurance will not allow it . The Relpax is for abortive therapy .

## 2015-01-06 ENCOUNTER — Ambulatory Visit: Payer: BLUE CROSS/BLUE SHIELD | Admitting: Internal Medicine

## 2015-01-07 ENCOUNTER — Other Ambulatory Visit: Payer: Self-pay | Admitting: Neurology

## 2015-01-25 ENCOUNTER — Encounter: Payer: Self-pay | Admitting: Internal Medicine

## 2015-01-25 NOTE — Telephone Encounter (Signed)
My Chart account has been deactivated.

## 2015-02-01 ENCOUNTER — Ambulatory Visit: Payer: BLUE CROSS/BLUE SHIELD | Admitting: Neurology

## 2015-02-04 ENCOUNTER — Encounter: Payer: Self-pay | Admitting: *Deleted

## 2015-02-04 NOTE — Progress Notes (Signed)
No show letter sent for missed appointment on 02/01/2015 

## 2016-03-10 DIAGNOSIS — J45909 Unspecified asthma, uncomplicated: Secondary | ICD-10-CM | POA: Insufficient documentation

## 2016-03-10 DIAGNOSIS — R112 Nausea with vomiting, unspecified: Secondary | ICD-10-CM | POA: Insufficient documentation

## 2016-03-10 DIAGNOSIS — R1011 Right upper quadrant pain: Secondary | ICD-10-CM | POA: Insufficient documentation

## 2016-03-10 DIAGNOSIS — R1031 Right lower quadrant pain: Secondary | ICD-10-CM | POA: Insufficient documentation

## 2016-03-11 ENCOUNTER — Emergency Department (HOSPITAL_COMMUNITY)
Admission: EM | Admit: 2016-03-11 | Discharge: 2016-03-11 | Disposition: A | Discharge status: Home / Self Care | Payer: BLUE CROSS/BLUE SHIELD | Attending: Emergency Medicine | Admitting: Emergency Medicine

## 2016-03-11 ENCOUNTER — Emergency Department (HOSPITAL_COMMUNITY): Payer: BLUE CROSS/BLUE SHIELD

## 2016-03-11 ENCOUNTER — Encounter (HOSPITAL_COMMUNITY): Payer: Self-pay | Admitting: Emergency Medicine

## 2016-03-11 DIAGNOSIS — R109 Unspecified abdominal pain: Secondary | ICD-10-CM

## 2016-03-11 DIAGNOSIS — R110 Nausea: Secondary | ICD-10-CM

## 2016-03-11 DIAGNOSIS — R112 Nausea with vomiting, unspecified: Secondary | ICD-10-CM

## 2016-03-11 LAB — COMPREHENSIVE METABOLIC PANEL
ALT: 14 U/L (ref 14–54)
AST: 17 U/L (ref 15–41)
Albumin: 4 g/dL (ref 3.5–5.0)
Alkaline Phosphatase: 67 U/L (ref 38–126)
Anion gap: 4 — ABNORMAL LOW (ref 5–15)
BILIRUBIN TOTAL: 0.5 mg/dL (ref 0.3–1.2)
BUN: 13 mg/dL (ref 6–20)
CO2: 27 mmol/L (ref 22–32)
Calcium: 9.1 mg/dL (ref 8.9–10.3)
Chloride: 108 mmol/L (ref 101–111)
Creatinine, Ser: 0.81 mg/dL (ref 0.44–1.00)
GFR calc Af Amer: >60 mL/min (ref >60)
GFR calc non Af Amer: >60 mL/min (ref >60)
Glucose, Bld: 102 mg/dL — ABNORMAL HIGH (ref 65–99)
Potassium: 4 mmol/L (ref 3.5–5.1)
Sodium: 139 mmol/L (ref 135–145)
Total Protein: 7 g/dL (ref 6.5–8.1)

## 2016-03-11 LAB — CBC
HEMATOCRIT: 36 % (ref 36.0–46.0)
Hemoglobin: 12 g/dL (ref 12.0–15.0)
MCH: 28.7 pg (ref 26.0–34.0)
MCHC: 33.3 g/dL (ref 30.0–36.0)
MCV: 86.1 fL (ref 78.0–100.0)
PLATELETS: 253 10*3/uL (ref 150–400)
RBC: 4.18 MIL/uL (ref 3.87–5.11)
RDW: 13.4 % (ref 11.5–15.5)
WBC: 7.2 10*3/uL (ref 4.0–10.5)

## 2016-03-11 LAB — WET PREP, GENITAL
Clue Cells Wet Prep HPF POC: NONE SEEN
SPERM: NONE SEEN
Trich, Wet Prep: NONE SEEN
WBC, Wet Prep HPF POC: NONE SEEN
YEAST WET PREP: NONE SEEN

## 2016-03-11 LAB — URINALYSIS, ROUTINE W REFLEX MICROSCOPIC
Bilirubin Urine: NEGATIVE
Glucose, UA: NEGATIVE mg/dL
Hgb urine dipstick: NEGATIVE
Ketones, ur: NEGATIVE mg/dL
Leukocytes, UA: NEGATIVE
NITRITE: NEGATIVE
PH: 7.5 (ref 5.0–8.0)
PROTEIN: 30 mg/dL — AB
Specific Gravity, Urine: 1.039 — ABNORMAL HIGH (ref 1.005–1.030)

## 2016-03-11 LAB — I-STAT BETA HCG BLOOD, ED (MC, WL, AP ONLY)

## 2016-03-11 LAB — URINE MICROSCOPIC-ADD ON: RBC / HPF: NONE SEEN RBC/hpf (ref 0–5)

## 2016-03-11 LAB — LIPASE, BLOOD: Lipase: 23 U/L (ref 11–51)

## 2016-03-11 MED ORDER — ONDANSETRON 4 MG PO TBDP: ORAL_TABLET | ORAL | 0 refills | Status: AC

## 2016-03-11 MED ORDER — SODIUM CHLORIDE 0.9 % IV BOLUS (SEPSIS)
1000.0000 mL | Freq: Once | INTRAVENOUS | Status: AC
  Administered 2016-03-11: 1000 mL via INTRAVENOUS

## 2016-03-11 MED ORDER — OMEPRAZOLE 20 MG PO CPDR: 20.0000 mg | DELAYED_RELEASE_CAPSULE | Freq: Every day | ORAL | 0 refills | Status: AC

## 2016-03-11 MED ORDER — IOPAMIDOL (ISOVUE-300) INJECTION 61%
INTRAVENOUS | Status: AC
  Administered 2016-03-11: 100 mL
  Filled 2016-03-11: qty 100

## 2016-03-11 MED ORDER — FLUCONAZOLE 100 MG PO TABS
150.0000 mg | ORAL_TABLET | Freq: Once | ORAL | Status: AC
  Administered 2016-03-11: 150 mg via ORAL
  Filled 2016-03-11: qty 2

## 2016-03-11 NOTE — Discharge Instructions (Signed)
1. Medications: zofran, omeprazole, usual home medications °2. Treatment: rest, drink plenty of fluids, advance diet slowly °3. Follow Up: Please followup with your primary doctor in 2 days for discussion of your diagnoses and further evaluation after today's visit; if you do not have a primary care doctor use the resource guide provided to find one; Please return to the ER for persistent vomiting, high fevers or worsening symptoms ° ° °

## 2016-03-11 NOTE — ED Provider Notes (Signed)
[facility]-EMER[facility]ENCY DEPT Provider Note   CSN: [id number] Arrival date & time: 03/10/16  2357     History   Chief Complaint Chief Complaint  Patient presents with  . Back Pain  . Polyuria  . Abdominal Pain    HPI Debra Levy is a 25 y.o. female with a hx of Asthma, headache and obesity presents to the Emergency Department complaining of gradual, persistent, progressively worsening right upper quadrant and right lower quadrant abdominal pain onset 3 weeks ago.. Associated symptoms include nausea after every meal and intermittent vomiting. Denies hematemesis, melena or hematochezia. Patient notes urinary urgency and urinary frequency without dysuria. She reports that when she urinates it does increase her lower abdominal pain.  Nothing makes her symptoms better. She reports fever to 101 measured last night but lower today. She had associated chills at that time which have resolved. She denies chest pain, shortness of breath, syncope.  She is sexually active and is not on any birth control. LMP: 03/05/2016  Patient reports that over the summer she found out she was pregnant but on follow-up it appeared as if she had had a miscarriage. Patient reports [facility] and had a pelvic ultrasound which showed no IUP and no ectopic pregnancy. She reports she has a follow-up with them in 4 days.    The history is provided by the patient and medical records. No language interpreter was used.    Past Medical History:  Diagnosis Date  . Asthma   . Headache   . Obesity     Patient Active Problem List   Diagnosis Date Noted  . Chronic migraine 11/06/2014  . Migraine with aura and without status migrainosus, not intractable 11/06/2014  . Asthma, chronic 08/18/2014  . Migraine 10/16/2012  . Other and unspecified hyperlipidemia 10/16/2012  . Obesity, unspecified 10/16/2012    Past Surgical History:  Procedure Laterality Date  . ANKLE SURGERY Right     OB  History    No data available       Home Medications    Prior to Admission medications   Medication Sig Start Date End Date Taking? Authorizing Provider  APRI 0.15-30 MG-MCG tablet Take 1 tablet by mouth daily. 03/07/16  Yes Historical Provider, MD  cetirizine (ZYRTEC) 10 MG tablet Take 10 mg by mouth daily. 01/14/16 01/13/17 Yes Historical Provider, MD  hydrOXYzine (ATARAX/VISTARIL) 10 MG tablet Take 1 tablet by mouth 3 (three) times daily. 03/07/16  Yes Historical Provider, MD  ranitidine (ZANTAC) 150 MG tablet Take 150 mg by mouth 2 (two) times daily. 03/07/16  Yes Historical Provider, MD  omeprazole (PRILOSEC) 20 MG capsule Take 1 capsule (20 mg total) by mouth daily. 03/11/16   Jes Costales, PA-C  ondansetron (ZOFRAN ODT) 4 MG disintegrating tablet 4mg  ODT q4 hours prn nausea/vomit 03/11/16   Dahlia Client Ridge Lafond, PA-C    Family History Family History  Problem Relation Age of Onset  . Kidney failure Mother   . Asthma Mother   . Atrial fibrillation Mother   . Diabetes Father   . Hypertension Father   . Sleep apnea Father   . Rheum arthritis Maternal Grandmother   . Diabetes Maternal Grandfather   . Diabetes Paternal Grandmother   . Hypertension Paternal Grandmother     Social History Social History  Substance Use Topics  . Smoking status: Never Smoker  . Smokeless tobacco: Never Used  . Alcohol use No     Allergies   Review of patient's  allergies indicates no known allergies.   Review of Systems Review of Systems  Gastrointestinal: Positive for abdominal pain, nausea and vomiting.  Genitourinary: Positive for frequency and urgency.  Musculoskeletal: Positive for back pain.  All other systems reviewed and are negative.    Physical Exam Updated Vital Signs BP 127/78   Pulse 89   Temp 98.1 F (36.7 C) (Oral)   Resp 18   Ht 5\' 4"  (1.626 m)   Wt 84.8 kg   LMP 02/27/2016 (Exact Date)   SpO2 99%   BMI 32.10 kg/m   Physical Exam  Constitutional: She  appears well-developed and well-nourished.  HENT:  Head: Normocephalic and atraumatic.  Mouth/Throat: Oropharynx is clear and moist.  Eyes: Conjunctivae are normal. No scleral icterus.  Cardiovascular: Normal rate, regular rhythm and intact distal pulses.   Pulmonary/Chest: Effort normal and breath sounds normal.  Abdominal: Soft. Bowel sounds are normal. She exhibits no distension and no mass. There is tenderness in the right upper quadrant and right lower quadrant. There is guarding ( voluntary, mild all right sided ) and CVA tenderness (right). There is no rebound, no tenderness at McBurney's point and negative Murphy's sign.  Neurological: She is alert.  Skin: Skin is warm and dry.  Psychiatric: She has a normal mood and affect.  Nursing note and vitals reviewed.    ED Treatments / Results  Labs (all labs ordered are listed, but only abnormal results are displayed) Labs Reviewed  COMPREHENSIVE METABOLIC PANEL - Abnormal; Notable for the following:       Result Value   Glucose, Bld 102 (*)    Anion gap 4 (*)    All other components within normal limits  URINALYSIS, ROUTINE W REFLEX MICROSCOPIC (NOT AT [facility]) - Abnormal; Notable for the following:    Specific Gravity, Urine 1.039 (*)    Protein, ur 30 (*)    All other components within normal limits  URINE MICROSCOPIC-ADD ON - Abnormal; Notable for the following:    Squamous Epithelial / LPF 0-5 (*)    Bacteria, UA RARE (*)    All other components within normal limits  WET PREP, GENITAL  LIPASE, BLOOD  CBC  I-STAT BETA HCG BLOOD, ED ([facility], [facility], [facility] ONLY)  GC/CHLAMYDIA PROBE AMP ([place]) NOT AT [facility]     Radiology Ct Abdomen Pelvis W Contrast  Result Date: 03/11/2016 CLINICAL DATA:  Right upper quadrant and right lower quadrant abdominal pain. EXAM: CT ABDOMEN AND PELVIS WITH CONTRAST TECHNIQUE: Multidetector CT imaging of the abdomen and pelvis was performed using the standard protocol following bolus administration of  intravenous contrast. CONTRAST:  ISOVUE-300 IOPAMIDOL (ISOVUE-300) INJECTION 61% COMPARISON:  None. FINDINGS: Lower chest: No acute abnormality. Hepatobiliary: No focal liver abnormality is seen. No gallstones, gallbladder wall thickening, or biliary dilatation. Pancreas: Unremarkable. No pancreatic ductal dilatation or surrounding inflammatory changes. Spleen: Normal in size without focal abnormality. Adrenals/Urinary Tract: Adrenal glands are unremarkable. Kidneys are normal, without renal calculi, focal lesion, or hydronephrosis. Bladder is unremarkable. Stomach/Bowel: Stomach is within normal limits. Appendix appears normal. No evidence of bowel wall thickening, distention, or inflammatory changes. Stool throughout the colon. Vascular/Lymphatic: No significant vascular findings are present. No enlarged abdominal or pelvic lymph nodes. Reproductive: Uterus and bilateral adnexa are unremarkable. Other: No abdominal wall hernia or abnormality. No abdominopelvic ascites. Musculoskeletal: No acute or significant osseous findings. IMPRESSION: No acute process demonstrated in the abdomen or pelvis that would explain the patient's symptoms. No evidence of bowel obstruction or inflammation.  Appendix is normal. Electronically Signed   By: Burman NievesWilliam  Stevens M.D.   On: 03/11/2016 03:59    Procedures Procedures (including critical care time)  Medications Ordered in ED Medications  fluconazole (DIFLUCAN) tablet 150 mg (not administered)  sodium chloride 0.9 % bolus 1,000 mL (1,000 mLs Intravenous New Bag/Given 03/11/16 0254)  iopamidol (ISOVUE-300) 61 % injection (100 mLs  Contrast Given 03/11/16 0331)     Initial Impression / Assessment and Plan / ED Course  I have reviewed the triage vital signs and the nursing notes.  Pertinent labs & imaging results that were available during my care of the patient were reviewed by me and considered in my medical decision making (see chart for details).  Clinical  Course  Value Comment By Time  WBC, Wet Prep HPF POC: NONE SEEN No WBCs Soul Hackman, PA-C 09/23 0428  WBC: 7.2 No leukocytosis Dierdre ForthHannah Kimmie Berggren, PA-C 09/23 0429  Specific Gravity, Urine: (!) 1.039 Concentrated. Patient given fluids Dierdre ForthHannah Makinzey Banes, PA-C 09/23 0429   Normal Lipase Dierdre ForthHannah Ege Muckey, PA-C 09/23 0429  CT ABDOMEN PELVIS W CONTRAST No evidence of acute abdominal pathology.  Dahlia ClientHannah Serria Sloma, PA-C 09/23 0429   On repeat exam abdomen is soft and nontender.  She is well appearing. No emesis here in the department. Dahlia ClientHannah Genowefa Morga, PA-C 09/23 0430    Patient is nontoxic, nonseptic appearing, in no apparent distress.  Patient's pain and other symptoms adequately managed in emergency department.  Fluid bolus given.  Labs, imaging and vitals reviewed.  Patient does not meet the SIRS or Sepsis criteria.  On repeat exam patient does not have a surgical abdomin and there are no peritoneal signs.  No indication of appendicitis, bowel obstruction, bowel perforation, cholecystitis, diverticulitis, PID or ectopic pregnancy.  Patient discharged home with symptomatic treatment and given strict instructions for follow-up with their primary care physician.  I have also discussed reasons to return immediately to the ER.  Patient expresses understanding and agrees with plan.     Final Clinical Impressions(s) / ED Diagnoses   Final diagnoses:  Abdominal pain, unspecified abdominal location  Nausea  Non-intractable vomiting with nausea, vomiting of unspecified type    New Prescriptions New Prescriptions   OMEPRAZOLE (PRILOSEC) 20 MG CAPSULE    Take 1 capsule (20 mg total) by mouth daily.   ONDANSETRON (ZOFRAN ODT) 4 MG DISINTEGRATING TABLET    4mg  ODT q4 hours prn nausea/vomit     Dierdre ForthHannah Subrena Devereux, PA-C 03/11/16 [id number]    Shon Batonourtney F Horton, MD 03/12/16 [id number]

## 2016-03-11 NOTE — ED Notes (Signed)
Patient transported to CT 

## 2016-03-11 NOTE — ED Triage Notes (Signed)
Patient arrives with complaint of lower back pain, diffuse abdominal pain, and urinary urgency with frequent urination. States onset about 3 weeks ago. Also states that she was informed that she may have had a miscarriage around that time. LMP was 9/10. Currently denies bleeding.

## 2016-03-13 LAB — GC/CHLAMYDIA PROBE AMP (~~LOC~~) NOT AT ARMC
CHLAMYDIA, DNA PROBE: NEGATIVE
NEISSERIA GONORRHEA: NEGATIVE

## 2016-12-02 IMAGING — CT CT ABD-PELV W/ CM
2 of 4 series · 17 of 46 positions shown, 19 images · IV contrast (iopamidol)
Comparison: None.

CLINICAL DATA: Right upper quadrant and right lower quadrant
abdominal pain.

EXAM:
CT ABDOMEN AND PELVIS WITH CONTRAST
TECHNIQUE: Multidetector CT imaging of the abdomen and pelvis was performed
using the standard protocol following bolus administration of
intravenous contrast.
CONTRAST:  100mL 1UYEPE-4CC IOPAMIDOL (1UYEPE-4CC) INJECTION 61%

[Series 2: a/p w/ 5mm · axial · 0.87mm/px · z∈[+820,+1200]mm · 14 of 88 slices shown, 16 images]
[im 6/88  soft-tissue]
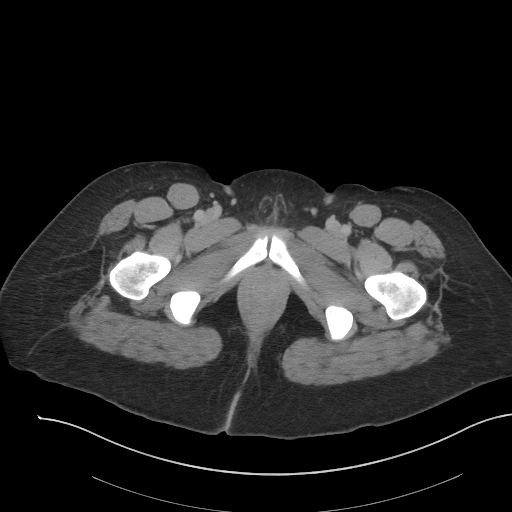
[im 6/88  bone]
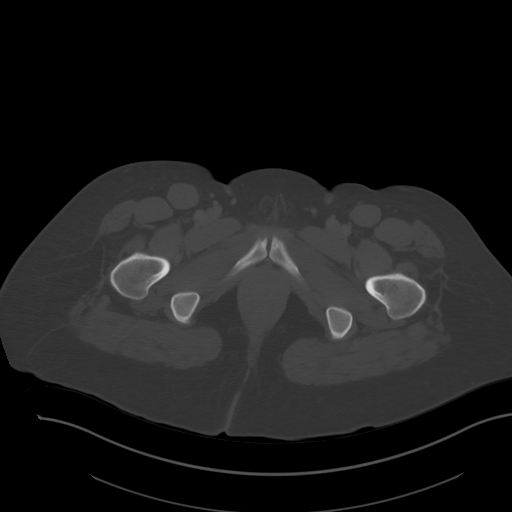
[im 11/88  soft-tissue]
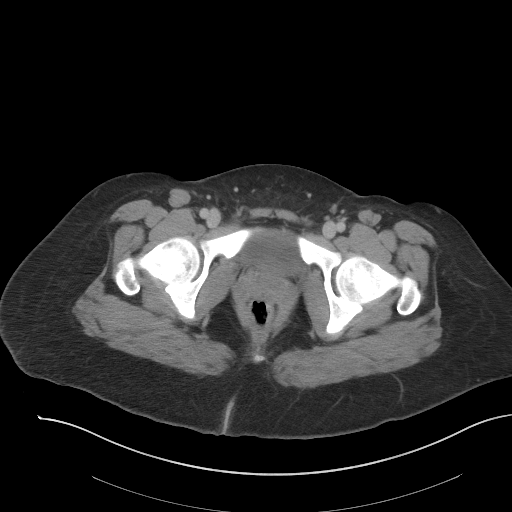
[im 16/88  soft-tissue]
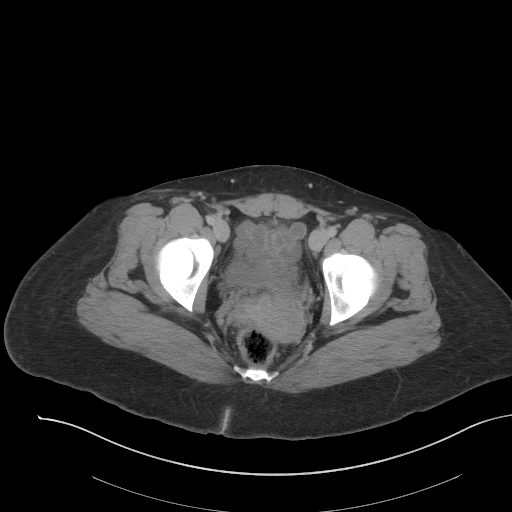
[im 26/88  soft-tissue]
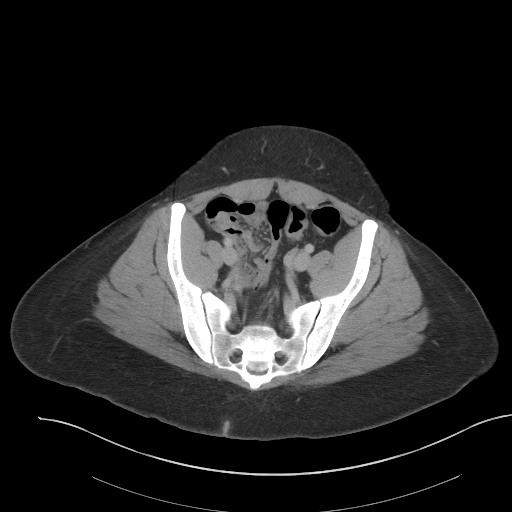
[im 31/88  soft-tissue]
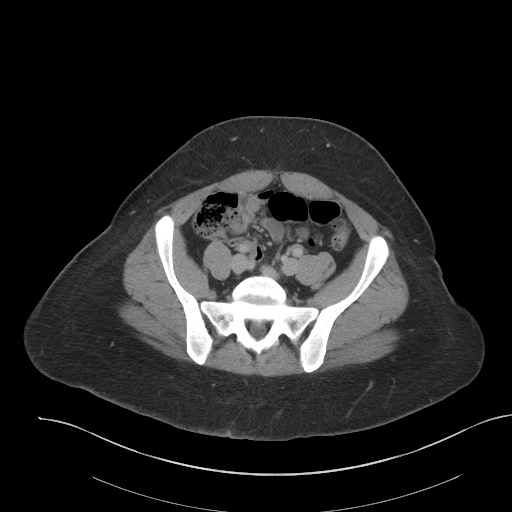
[im 36/88  soft-tissue]
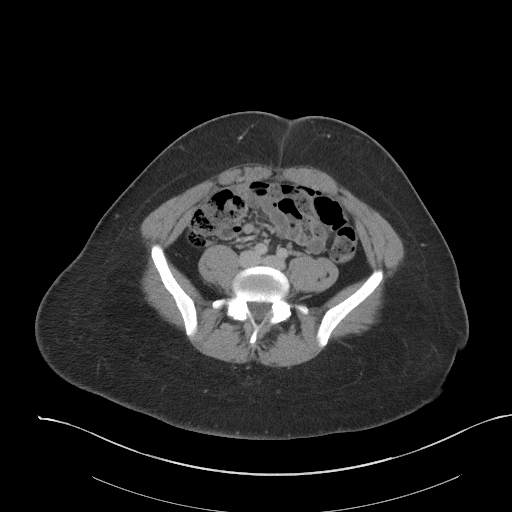
[im 41/88  soft-tissue]
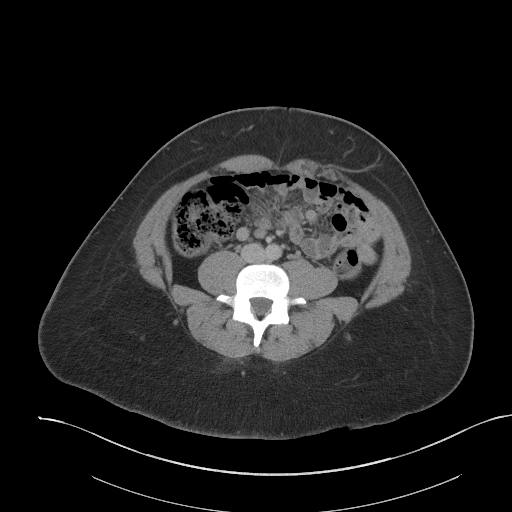
[im 47/88  soft-tissue]
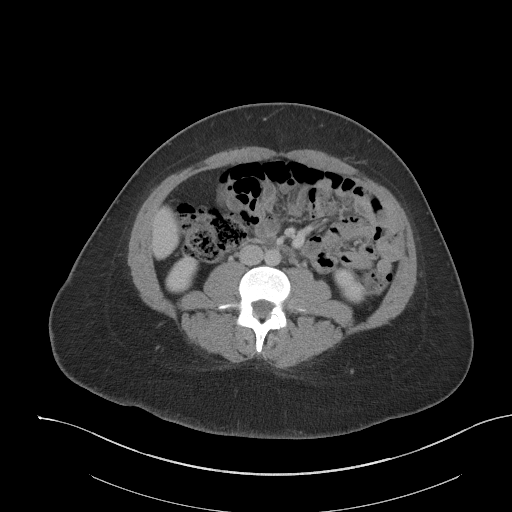
[im 52/88  soft-tissue]
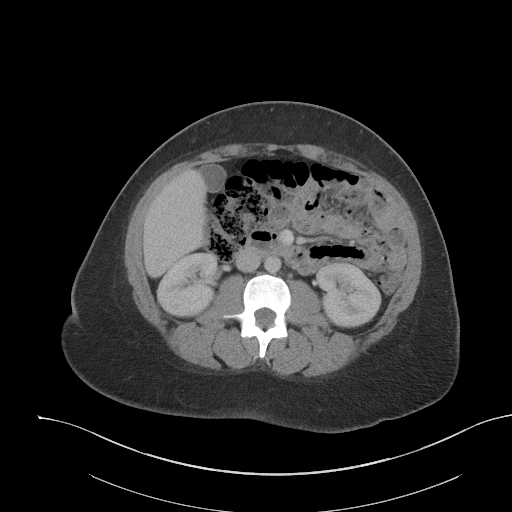
[im 52/88  bone]
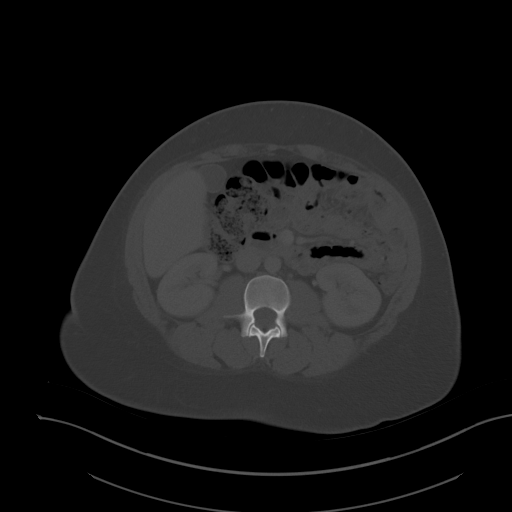
[im 57/88  soft-tissue]
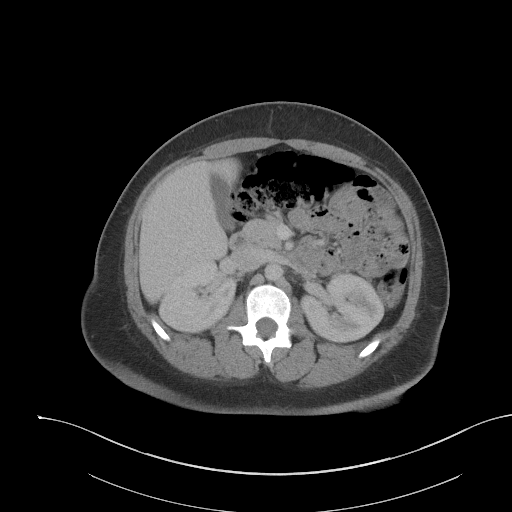
[im 67/88  soft-tissue]
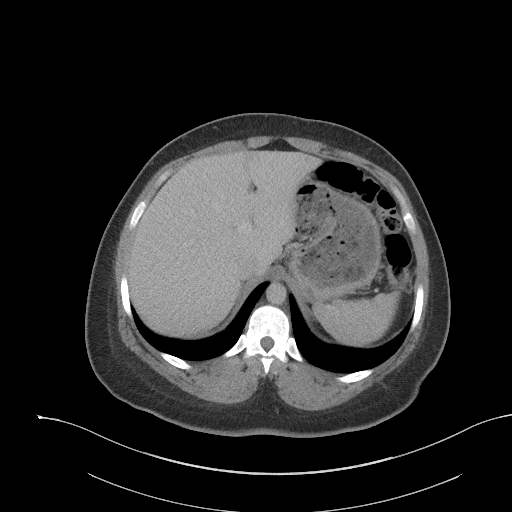
[im 72/88  soft-tissue]
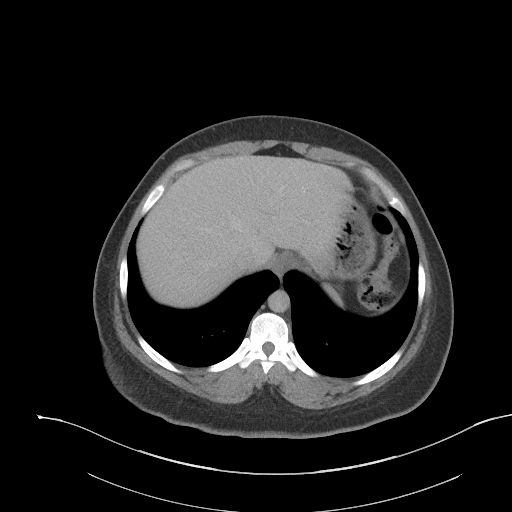
[im 77/88  soft-tissue]
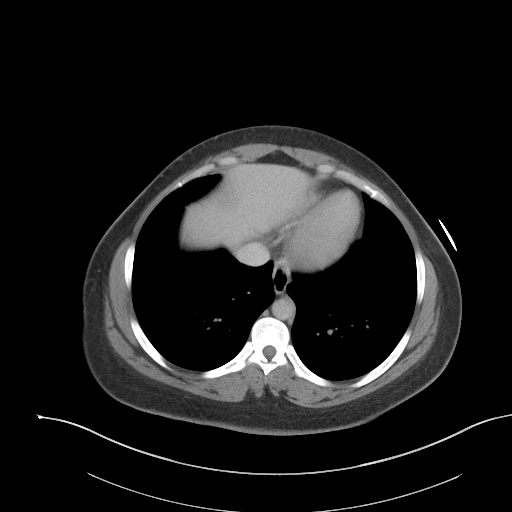
[im 82/88  soft-tissue]
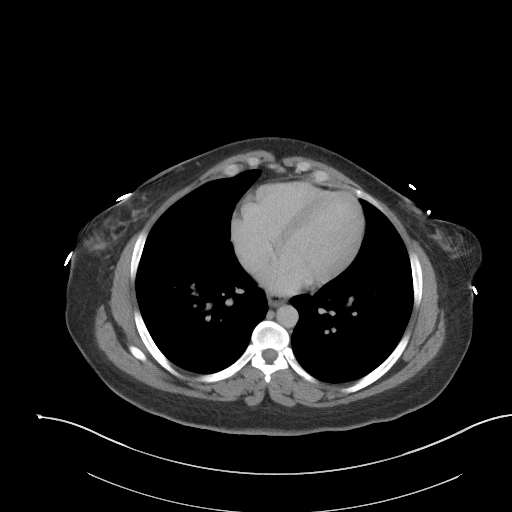

[Series 6: a/p w/ cor · coronal · 0.78mm/px · 3 of 130 slices shown]
[im 44/130  soft-tissue]
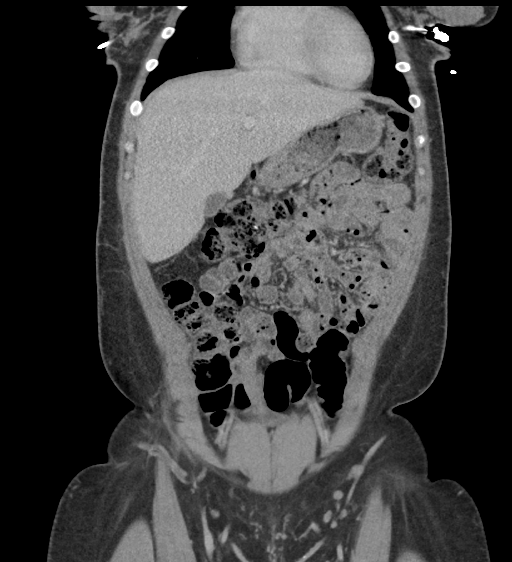
[im 58/130  soft-tissue]
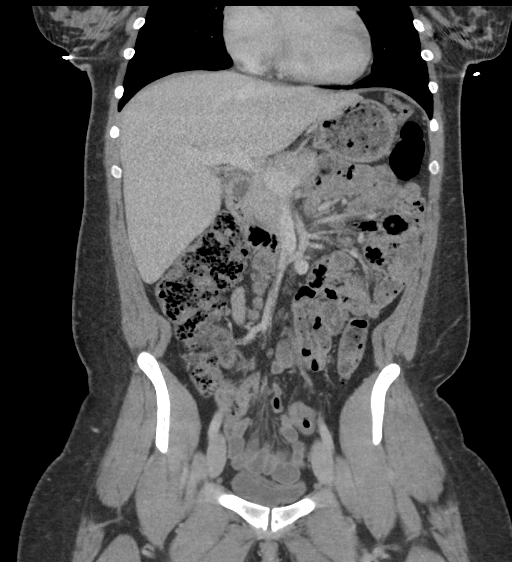
[im 72/130  soft-tissue]
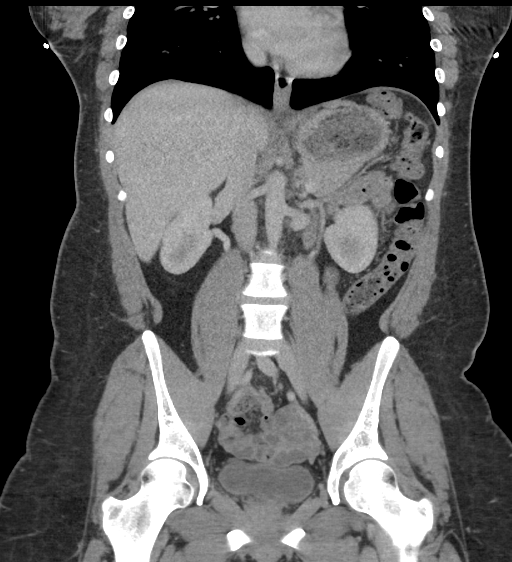

[17 of 46 positions shown; findings below may reference images not displayed]

FINDINGS: Lower chest: No acute abnormality.

Hepatobiliary: No focal liver abnormality is seen. No gallstones,
gallbladder wall thickening, or biliary dilatation.

Pancreas: Unremarkable. No pancreatic ductal dilatation or
surrounding inflammatory changes.

Spleen: Normal in size without focal abnormality.

Adrenals/Urinary Tract: Adrenal glands are unremarkable. Kidneys are
normal, without renal calculi, focal lesion, or hydronephrosis.
Bladder is unremarkable.

Stomach/Bowel: Stomach is within normal limits. Appendix appears
normal. No evidence of bowel wall thickening, distention, or
inflammatory changes. Stool throughout the colon.

Vascular/Lymphatic: No significant vascular findings are present. No
enlarged abdominal or pelvic lymph nodes.

Reproductive: Uterus and bilateral adnexa are unremarkable.

Other: No abdominal wall hernia or abnormality. No abdominopelvic
ascites.

Musculoskeletal: No acute or significant osseous findings.
IMPRESSION: No acute process demonstrated in the abdomen or pelvis that would
explain the patient's symptoms. No evidence of bowel obstruction or
inflammation. Appendix is normal.

## 2017-10-29 ENCOUNTER — Inpatient Hospital Stay (HOSPITAL_COMMUNITY): Payer: 59

## 2017-10-29 ENCOUNTER — Inpatient Hospital Stay (HOSPITAL_COMMUNITY)
Admission: AD | Admit: 2017-10-29 | Discharge: 2017-10-29 | Disposition: A | Discharge status: Home / Self Care | Payer: 59 | Source: Ambulatory Visit | Attending: Obstetrics & Gynecology | Admitting: Obstetrics & Gynecology

## 2017-10-29 ENCOUNTER — Encounter (HOSPITAL_COMMUNITY): Payer: Self-pay

## 2017-10-29 ENCOUNTER — Other Ambulatory Visit: Payer: Self-pay

## 2017-10-29 DIAGNOSIS — Z841 Family history of disorders of kidney and ureter: Secondary | ICD-10-CM | POA: Diagnosis not present

## 2017-10-29 DIAGNOSIS — O99212 Obesity complicating pregnancy, second trimester: Secondary | ICD-10-CM | POA: Diagnosis not present

## 2017-10-29 DIAGNOSIS — Z3A22 22 weeks gestation of pregnancy: Secondary | ICD-10-CM | POA: Diagnosis not present

## 2017-10-29 DIAGNOSIS — Z825 Family history of asthma and other chronic lower respiratory diseases: Secondary | ICD-10-CM | POA: Diagnosis not present

## 2017-10-29 DIAGNOSIS — O9989 Other specified diseases and conditions complicating pregnancy, childbirth and the puerperium: Secondary | ICD-10-CM | POA: Diagnosis not present

## 2017-10-29 DIAGNOSIS — Z8249 Family history of ischemic heart disease and other diseases of the circulatory system: Secondary | ICD-10-CM | POA: Insufficient documentation

## 2017-10-29 DIAGNOSIS — Z833 Family history of diabetes mellitus: Secondary | ICD-10-CM | POA: Insufficient documentation

## 2017-10-29 DIAGNOSIS — O9A212 Injury, poisoning and certain other consequences of external causes complicating pregnancy, second trimester: Secondary | ICD-10-CM | POA: Insufficient documentation

## 2017-10-29 DIAGNOSIS — E669 Obesity, unspecified: Secondary | ICD-10-CM | POA: Diagnosis not present

## 2017-10-29 DIAGNOSIS — R109 Unspecified abdominal pain: Secondary | ICD-10-CM | POA: Insufficient documentation
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
## 2017-10-29 NOTE — Discharge Instructions (Signed)
Preventing Injuries During Pregnancy °Trauma is the most common cause of injury and death in pregnant women. This can also result in serious harm to the baby or even death. °How can injuries affect my pregnancy? °Your baby is protected in the womb (uterus) by a sac filled with fluid (amniotic sac). Your baby can be harmed if there is a direct blow to your abdomen and pelvis. Trauma may be caused by: °· Falls. These are more common in the second and third trimester of pregnancy. °· Automobile accidents. °· Domestic violence or assault. °· Severe burns, such as from fire or electricity. ° °These injuries can result in: °· Tearing of your uterus. °· The placenta pulling away from the wall of the uterus (placental abruption). °· The amniotic sac breaking open (rupture of membranes). °· Blockage or decrease in the blood supply to your baby. °· Going into labor earlier than expected. °· Severe injuries to other parts of your body, such as your brain, spine, heart, lungs, or other organs. ° °Minor falls and low-impact automobile accidents do not usually harm your baby, even if they cause a little harm to you. °What can I do to lower my risk? °Safety °· Remove slippery rugs and loose objects on the floor. They increase your risk of tripping or slipping. °· Wear comfortable shoes that have a good grip on the sole. Do not wear high-heeled shoes. °· Always wear your seat belt properly when riding in a car. Use both the lap and shoulder belt, with the lap belt below your abdomen. Always practice safe driving. Do not ride on a motorcycle while pregnant. °Activity °· Avoid walking on wet or slippery floors. °· Do not participate in rough and violent activities or sports. °· Avoid high-risk situations and activities such as: °? Lifting heavy pots of boiling or hot liquids. °? Fixing electrical problems. °? Being near fires or starting fires. °General instructions °· Take over-the-counter and prescription medicines only as told by  your health care provider. °· Know your blood type and the father's blood type in case you develop vaginal bleeding or experience an injury for which a blood transfusion is needed. °· Spousal abuse can be a serious cause of trauma during pregnancy. If you are a victim of domestic violence or assault: °? Call your local emergency services (911 in the [place] °? Contact the National Domestic Violence Hotline for help and support. °When should I seek immediate medical care? °Get help right away if: °· You fall on your abdomen or experience any serious blow to your abdomen. °· You develop stiffness in your neck or pain after a fall or from other trauma. °· You develop a headache or vision problems after a fall or from other trauma. °· You do not feel the baby moving after a fall or trauma, or you feel that the baby is not moving as much as before the fall or trauma. °· You have been the victim of domestic violence or any other kind of physical attack. °· You have been in a car accident. °· You develop vaginal bleeding. °· You have fluid leaking from the vagina. °· You develop uterine contractions. Symptoms include pelvic cramping, pain, or serious low back pain. °· You become weak, faint, or have uncontrolled vomiting after trauma. °· You have a serious burn. This includes burns to the face, neck, hands, or genitals, or burns greater than the size of your palm anywhere else. ° °Summary °· Trauma is the most common cause of   injury and death in pregnant women and can also lead to injury or death of the baby. °· Falls, automobile accidents, domestic violence or assault, and severe burns can injure you or your baby. Make sure to get medical help right away if you experience any of these during your pregnancy. °· Take steps to prevent slips or falls in your home, such as avoiding slippery floors and removing loose rugs. °· Always wear your seat belt properly when riding in a car. Practice safe driving. °This information is  not intended to replace advice given to you by your health care provider. Make sure you discuss any questions you have with your health care provider. °Document Released: 07/13/2004 Document Revised: 06/14/2016 Document Reviewed: 06/14/2016 °Elsevier Interactive Patient Education © 2017 [employer] ° °
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
## 2018-05-21 ENCOUNTER — Encounter (HOSPITAL_COMMUNITY): Payer: Self-pay
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
